# Patient Record
Sex: Female | Born: 1955 | Race: White | Hispanic: No | Marital: Married | Smoking: Never smoker
Health system: Southern US, Community
[De-identification: ages and names within clinical notes are randomized; demographics above are authoritative.]

## PROBLEM LIST (undated history)

## (undated) DIAGNOSIS — M1990 Unspecified osteoarthritis, unspecified site: Secondary | ICD-10-CM

## (undated) DIAGNOSIS — F329 Major depressive disorder, single episode, unspecified: Secondary | ICD-10-CM

## (undated) DIAGNOSIS — M0520 Rheumatoid vasculitis with rheumatoid arthritis of unspecified site: Secondary | ICD-10-CM

## (undated) DIAGNOSIS — I10 Essential (primary) hypertension: Secondary | ICD-10-CM

## (undated) DIAGNOSIS — I2510 Atherosclerotic heart disease of native coronary artery without angina pectoris: Secondary | ICD-10-CM

## (undated) DIAGNOSIS — E785 Hyperlipidemia, unspecified: Secondary | ICD-10-CM

## (undated) DIAGNOSIS — J45909 Unspecified asthma, uncomplicated: Secondary | ICD-10-CM

## (undated) DIAGNOSIS — J069 Acute upper respiratory infection, unspecified: Secondary | ICD-10-CM

## (undated) DIAGNOSIS — E119 Type 2 diabetes mellitus without complications: Secondary | ICD-10-CM

## (undated) DIAGNOSIS — C4492 Squamous cell carcinoma of skin, unspecified: Secondary | ICD-10-CM

## (undated) HISTORY — DX: Essential (primary) hypertension: I10

## (undated) HISTORY — DX: Hyperlipidemia, unspecified: E78.5

## (undated) HISTORY — PX: TONSILLECTOMY: SUR1361

## (undated) HISTORY — DX: Type 2 diabetes mellitus without complications: E11.9

## (undated) HISTORY — DX: Major depressive disorder, single episode, unspecified: F32.9

## (undated) HISTORY — DX: Squamous cell carcinoma of skin, unspecified: C44.92

## (undated) HISTORY — DX: Rheumatoid vasculitis with rheumatoid arthritis of unspecified site: M05.20

## (undated) HISTORY — DX: Atherosclerotic heart disease of native coronary artery without angina pectoris: I25.10

## (undated) HISTORY — PX: FOOT SURGERY: SHX648

## (undated) HISTORY — DX: Acute upper respiratory infection, unspecified: J06.9

---

## 2011-04-30 DIAGNOSIS — R072 Precordial pain: Secondary | ICD-10-CM

## 2015-08-06 ENCOUNTER — Telehealth (INDEPENDENT_AMBULATORY_CARE_PROVIDER_SITE_OTHER): Payer: Self-pay | Admitting: Internal Medicine

## 2015-08-06 NOTE — Telephone Encounter (Signed)
Shawna Williams left a message on the work voice mail saying she needs a colonoscopy and would like a phone call regarding this.  Pt's ph# [phone] Thank you.

## 2015-08-06 NOTE — Telephone Encounter (Signed)
Spoke to patient, advised we need referral from PCP since we've never seen her, she will call PCP and have them fax to [place]

## 2015-09-19 ENCOUNTER — Encounter (INDEPENDENT_AMBULATORY_CARE_PROVIDER_SITE_OTHER): Payer: Self-pay | Admitting: *Deleted

## 2015-10-09 ENCOUNTER — Encounter (INDEPENDENT_AMBULATORY_CARE_PROVIDER_SITE_OTHER): Payer: Self-pay | Admitting: *Deleted

## 2015-10-10 ENCOUNTER — Other Ambulatory Visit (INDEPENDENT_AMBULATORY_CARE_PROVIDER_SITE_OTHER): Payer: Self-pay | Admitting: *Deleted

## 2015-10-10 DIAGNOSIS — Z1211 Encounter for screening for malignant neoplasm of colon: Secondary | ICD-10-CM

## 2015-11-19 ENCOUNTER — Other Ambulatory Visit (INDEPENDENT_AMBULATORY_CARE_PROVIDER_SITE_OTHER): Payer: Self-pay | Admitting: *Deleted

## 2015-11-19 ENCOUNTER — Encounter (INDEPENDENT_AMBULATORY_CARE_PROVIDER_SITE_OTHER): Payer: Self-pay | Admitting: *Deleted

## 2015-11-19 NOTE — Telephone Encounter (Signed)
Patient needs trilyte 

## 2015-11-24 MED ORDER — PEG 3350-KCL-NA BICARB-NACL 420 G PO SOLR: 4000.0000 mL | Freq: Once | ORAL | Status: DC

## 2015-12-05 ENCOUNTER — Telehealth (INDEPENDENT_AMBULATORY_CARE_PROVIDER_SITE_OTHER): Payer: Self-pay | Admitting: *Deleted

## 2015-12-05 NOTE — Telephone Encounter (Signed)
Referring MD/PCP: shah   Procedure: tcs  Reason/Indication:  screening  Has patient had this procedure before?  no  If so, when, by whom and where?    Is there a family history of colon cancer?  no  Who?  What age when diagnosed?    Is patient diabetic?   no      Does patient have prosthetic heart valve or mechanical valve?  o  Do you have a pacemaker?  no  Has patient ever had endocarditis? no  Has patient had joint replacement within last 12 months?  no  Does patient tend to be constipated or take laxatives? no  Does patient have a history of alcohol/drug use?  no  Is patient on Coumadin, Plavix and/or Aspirin? no  Medications: folic acid 1 mg bid, omeprazole 20 mg daily, valsart/hctz 160/12.5 mg daily, methotrexate inj 25 mil weekly, topramate 50 mg daily, imbrel 50 mg sure click one a week  Allergies: codeine  Medication Adjustment:   Procedure date & time: 01/01/16 at 930

## 2015-12-09 NOTE — Telephone Encounter (Signed)
agree

## 2016-01-01 ENCOUNTER — Ambulatory Visit (HOSPITAL_COMMUNITY)
Admission: RE | Admit: 2016-01-01 | Discharge: 2016-01-01 | Disposition: A | Discharge status: Home / Self Care | Payer: BC Managed Care – PPO | Source: Ambulatory Visit | Attending: Internal Medicine | Admitting: Internal Medicine

## 2016-01-01 ENCOUNTER — Encounter (HOSPITAL_COMMUNITY): Payer: Self-pay | Admitting: *Deleted

## 2016-01-01 ENCOUNTER — Encounter (HOSPITAL_COMMUNITY)
Admission: RE | Disposition: A | Discharge status: Home / Self Care | Payer: Self-pay | Source: Ambulatory Visit | Attending: Internal Medicine

## 2016-01-01 DIAGNOSIS — Z1211 Encounter for screening for malignant neoplasm of colon: Secondary | ICD-10-CM

## 2016-01-01 DIAGNOSIS — M069 Rheumatoid arthritis, unspecified: Secondary | ICD-10-CM | POA: Diagnosis not present

## 2016-01-01 DIAGNOSIS — Z79899 Other long term (current) drug therapy: Secondary | ICD-10-CM | POA: Insufficient documentation

## 2016-01-01 DIAGNOSIS — K644 Residual hemorrhoidal skin tags: Secondary | ICD-10-CM

## 2016-01-01 DIAGNOSIS — K5730 Diverticulosis of large intestine without perforation or abscess without bleeding: Secondary | ICD-10-CM | POA: Insufficient documentation

## 2016-01-01 DIAGNOSIS — I10 Essential (primary) hypertension: Secondary | ICD-10-CM | POA: Insufficient documentation

## 2016-01-01 HISTORY — DX: Unspecified osteoarthritis, unspecified site: M19.90

## 2016-01-01 HISTORY — DX: Essential (primary) hypertension: I10

## 2016-01-01 HISTORY — PX: COLONOSCOPY: SHX5424

## 2016-01-01 SURGERY — COLONOSCOPY
Anesthesia: Moderate Sedation

## 2016-01-01 MED ORDER — MIDAZOLAM HCL 5 MG/5ML IJ SOLN
INTRAMUSCULAR | Status: AC
  Filled 2016-01-01: qty 10

## 2016-01-01 MED ORDER — MEPERIDINE HCL 50 MG/ML IJ SOLN
INTRAMUSCULAR | Status: DC | PRN
  Administered 2016-01-01 (×2): 25 mg via INTRAVENOUS

## 2016-01-01 MED ORDER — MIDAZOLAM HCL 5 MG/5ML IJ SOLN
INTRAMUSCULAR | Status: DC | PRN
  Administered 2016-01-01 (×2): 2 mg via INTRAVENOUS
  Administered 2016-01-01: 1 mg via INTRAVENOUS
  Administered 2016-01-01 (×2): 2 mg via INTRAVENOUS
  Administered 2016-01-01: 1 mg via INTRAVENOUS

## 2016-01-01 MED ORDER — SODIUM CHLORIDE 0.9 % IV SOLN
INTRAVENOUS | Status: DC
  Administered 2016-01-01: 1000 mL via INTRAVENOUS

## 2016-01-01 MED ORDER — MEPERIDINE HCL 50 MG/ML IJ SOLN
INTRAMUSCULAR | Status: AC
  Filled 2016-01-01: qty 1

## 2016-01-01 NOTE — H&P (Signed)
Shawna Williams is an 60 y.o. female.   Chief Complaint: Patient is here for colonoscopy. HPI: Patient is 60 year old Caucasian female who is here for screening colonoscopy. She denies abdominal pain change in bowel habits or rectal bleeding. This is patient's first exam. Family history is negative for CRC.  Past Medical History  Diagnosis Date  . Hypertension   . Arthritis     rhemuatoid    Past Surgical History  Procedure Laterality Date  . Cesarean section  1981 and 1985  . Foot surgery Left     with screws  . Tonsillectomy      Family History  Problem Relation Age of Onset  . Stroke Mother   . Diabetes Mother   . Heart attack Father   . Cancer Sister   . Diabetes Brother   . Diabetes Sister   . Heart attack Sister    Social History:  reports that she has never smoked. She does not have any smokeless tobacco history on file. She reports that she drinks alcohol. She reports that she does not use illicit drugs.  Allergies:  Allergies  Allergen Reactions  . Codeine Nausea Only    Medications Prior to Admission  Medication Sig Dispense Refill  . etanercept (ENBREL) 50 MG/ML injection Inject 50 mg into the skin once a week.    . folic acid (FOLVITE) 1 MG tablet Take 1 mg by mouth daily. Twice daily    . methotrexate (50 MG/ML) 1 g injection Inject 25 mg into the muscle once. weekly    . omeprazole (PRILOSEC) 40 MG capsule Take 40 mg by mouth daily.    . polyethylene glycol-electrolytes (NULYTELY/GOLYTELY) 420 g solution Take 4,000 mLs by mouth once. 4000 mL 0  . traZODone (DESYREL) 50 MG tablet Take 50 mg by mouth at bedtime. 1/2 tablet    . valsartan (DIOVAN) 160 MG tablet Take 160 mg by mouth daily.    Marland Kitchen venlafaxine (EFFEXOR) 75 MG tablet Take 75 mg by mouth once.      No results found for this or any previous visit (from the past 48 hour(s)). No results found.  ROS  Blood pressure 126/87, pulse 90, temperature 98.3 F (36.8 C), temperature source Oral, resp.  rate 13, height 5\' 6"  (1.676 m), weight 215 lb (97.523 kg), SpO2 97 %. Physical Exam  Constitutional: She appears well-developed and well-nourished.  HENT:  Mouth/Throat: Oropharynx is clear and moist.  Eyes: Conjunctivae are normal. No scleral icterus.  Neck: No thyromegaly present.  Cardiovascular: Normal rate, regular rhythm and normal heart sounds.   No murmur heard. Respiratory: Effort normal and breath sounds normal.  GI: She exhibits no distension and no mass. There is no tenderness.  Musculoskeletal: She exhibits no edema.  Lymphadenopathy:    She has no cervical adenopathy.  Neurological: She is alert.  Skin: Skin is warm and dry.     Assessment/Plan Average risk screening colonoscopy.  , MD 01/01/2016, 9:32 AM

## 2016-01-01 NOTE — Discharge Instructions (Signed)
Resume usual medications and high fiber diet. °No driving for 24 hours. °Next screening exam in 10 years. ° °Colonoscopy, Care After °These instructions give you information on caring for yourself after your procedure. Your doctor may also give you more specific instructions. Call your doctor if you have any problems or questions after your procedure. °HOME CARE °· Do not drive for 24 hours. °· Do not sign important papers or use machinery for 24 hours. °· You may shower. °· You may go back to your usual activities, but go slower for the first 24 hours. °· Take rest breaks often during the first 24 hours. °· Walk around or use warm packs on your belly (abdomen) if you have belly cramping or gas. °· Drink enough fluids to keep your pee (urine) clear or pale yellow. °· Resume your normal diet. Avoid heavy or fried foods. °· Avoid drinking alcohol for 24 hours or as told by your doctor. °· Only take medicines as told by your doctor. °If a tissue sample (biopsy) was taken during the procedure:  °· Do not take aspirin or blood thinners for 7 days, or as told by your doctor. °· Do not drink alcohol for 7 days, or as told by your doctor. °· Eat soft foods for the first 24 hours. °GET HELP IF: °You still have a small amount of blood in your poop (stool) 2-3 days after the procedure. °GET HELP RIGHT AWAY IF: °· You have more than a small amount of blood in your poop. °· You see clumps of tissue (blood clots) in your poop. °· Your belly is puffy (swollen). °· You feel sick to your stomach (nauseous) or throw up (vomit). °· You have a fever. °· You have belly pain that gets worse and medicine does not help. °MAKE SURE YOU: °· Understand these instructions. °· Will watch your condition. °· Will get help right away if you are not doing well or get worse. °  °This information is not intended to replace advice given to you by your health care provider. Make sure you discuss any questions you have with your health care provider. °  °Document Released: 08/07/2010 Document Revised: 07/10/2013 Document Reviewed: 03/12/2013 °Elsevier Interactive Patient Education ©2016 [employer] ° ° °Diverticulosis °Diverticulosis is the condition that develops when small pouches (diverticula) form in the wall of your colon. Your colon, or large intestine, is where water is absorbed and stool is formed. The pouches form when the inside layer of your colon pushes through weak spots in the outer layers of your colon. °CAUSES  °No one knows exactly what causes diverticulosis. °RISK FACTORS °· Being older than 50. Your risk for this condition increases with age. Diverticulosis is rare in people younger than 40 years. By age 80, almost everyone has it. °· Eating a low-fiber diet. °· Being frequently constipated. °· Being overweight. °· Not getting enough exercise. °· Smoking. °· Taking over-the-counter pain medicines, like aspirin and ibuprofen. °SYMPTOMS  °Most people with diverticulosis do not have symptoms. °DIAGNOSIS  °Because diverticulosis often has no symptoms, health care providers often discover the condition during an exam for other colon problems. In many cases, a health care provider will diagnose diverticulosis while using a flexible scope to examine the colon (colonoscopy). °TREATMENT  °If you have never developed an infection related to diverticulosis, you may not need treatment. If you have had an infection before, treatment may include: °· Eating more fruits, vegetables, and grains. °· Taking a fiber supplement. °· Taking a   live bacteria supplement (probiotic). °· Taking medicine to relax your colon. °HOME CARE INSTRUCTIONS  °· Drink at least 6-8 glasses of water each day to prevent constipation. °· Try not to strain when you have a bowel movement. °· Keep all follow-up appointments. °If you have had an infection before:  °· Increase the fiber in your diet as directed by your health care provider or dietitian. °· Take a dietary fiber supplement if  your health care provider approves. °· Only take medicines as directed by your health care provider. °SEEK MEDICAL CARE IF:  °· You have abdominal pain. °· You have bloating. °· You have cramps. °· You have not gone to the bathroom in 3 days. °SEEK IMMEDIATE MEDICAL CARE IF:  °· Your pain gets worse. °· Your bloating becomes very bad. °· You have a fever or chills, and your symptoms suddenly get worse. °· You begin vomiting. °· You have bowel movements that are bloody or black. °MAKE SURE YOU: °· Understand these instructions. °· Will watch your condition. °· Will get help right away if you are not doing well or get worse. °  °This information is not intended to replace advice given to you by your health care provider. Make sure you discuss any questions you have with your health care provider. °  °Document Released: 04/01/2004 Document Revised: 07/10/2013 Document Reviewed: 05/30/2013 °Elsevier Interactive Patient Education ©2016 [employer] ° ° °High-Fiber Diet °Fiber, also called dietary fiber, is a type of carbohydrate found in fruits, vegetables, whole grains, and beans. A high-fiber diet can have many health benefits. Your health care provider may recommend a high-fiber diet to help: °· Prevent constipation. Fiber can make your bowel movements more regular. °· Lower your cholesterol. °· Relieve hemorrhoids, uncomplicated diverticulosis, or irritable bowel syndrome. °· Prevent overeating as part of a weight-loss plan. °· Prevent heart disease, type 2 diabetes, and certain cancers. °WHAT IS MY PLAN? °The recommended daily intake of fiber includes: °· 38 grams for men under age 50. °· 30 grams for men over age 50. °· 25 grams for women under age 50. °· 21 grams for women over age 50. °You can get the recommended daily intake of dietary fiber by eating a variety of fruits, vegetables, grains, and beans. Your health care provider may also recommend a fiber supplement if it is not possible to get enough fiber  through your diet. °WHAT DO I NEED TO KNOW ABOUT A HIGH-FIBER DIET? °· Fiber supplements have not been widely studied for their effectiveness, so it is better to get fiber through food sources. °· Always check the fiber content on the nutrition facts label of any prepackaged food. Look for foods that contain at least 5 grams of fiber per serving. °· Ask your dietitian if you have questions about specific foods that are related to your condition, especially if those foods are not listed in the following section. °· Increase your daily fiber consumption gradually. Increasing your intake of dietary fiber too quickly may cause bloating, cramping, or gas. °· Drink plenty of water. Water helps you to digest fiber. °WHAT FOODS CAN I EAT? °Grains °Whole-grain breads. Multigrain cereal. Oats and oatmeal. Brown rice. Barley. Bulgur wheat. Millet. Bran muffins. Popcorn. Rye wafer crackers. °Vegetables °Sweet potatoes. Spinach. Kale. Artichokes. Cabbage. Broccoli. Green peas. Carrots. Squash. °Fruits °Berries. Pears. Apples. Oranges. Avocados. Prunes and raisins. Dried figs. °Meats and Other Protein Sources °Navy, kidney, pinto, and soy beans. Split peas. Lentils. Nuts and seeds. °Dairy °Fiber-fortified yogurt. °Beverages °Fiber-fortified soy milk. Fiber-fortified   orange juice. °Other °Fiber bars. °The items listed above may not be a complete list of recommended foods or beverages. Contact your dietitian for more options. °WHAT FOODS ARE NOT RECOMMENDED? °Grains °White bread. Pasta made with refined flour. White rice. °Vegetables °Fried potatoes. Canned vegetables. Well-cooked vegetables.  °Fruits °Fruit juice. Cooked, strained fruit. °Meats and Other Protein Sources °Fatty cuts of meat. Fried poultry or fried fish. °Dairy °Milk. Yogurt. Cream cheese. Sour cream. °Beverages °Soft drinks. °Other °Cakes and pastries. Butter and oils. °The items listed above may not be a complete list of foods and beverages to avoid. Contact your  dietitian for more information. °WHAT ARE SOME TIPS FOR INCLUDING HIGH-FIBER FOODS IN MY DIET? °· Eat a wide variety of high-fiber foods. °· Make sure that half of all grains consumed each day are whole grains. °· Replace breads and cereals made from refined flour or white flour with whole-grain breads and cereals. °· Replace white rice with brown rice, bulgur wheat, or millet. °· Start the day with a breakfast that is high in fiber, such as a cereal that contains at least 5 grams of fiber per serving. °· Use beans in place of meat in soups, salads, or pasta. °· Eat high-fiber snacks, such as berries, raw vegetables, nuts, or popcorn. °  °This information is not intended to replace advice given to you by your health care provider. Make sure you discuss any questions you have with your health care provider. °  °Document Released: 07/05/2005 Document Revised: 07/26/2014 Document Reviewed: 12/18/2013 °Elsevier Interactive Patient Education ©2016 [employer]vier Inc. ° °

## 2016-01-01 NOTE — Op Note (Signed)
[facility] Patient Name: Shawna Williams Procedure Date: 01/01/2016 9:14 AM MRN: [id number] Date of Birth: [birth year] Attending MD: Lionel December , MD CSN: [id number] Age: 60 Admit Type: Outpatient Procedure:                Colonoscopy Indications:              Screening for colorectal malignant neoplasm Providers:                Lionel December, MD, Nena Polio, RN, Calton Dach,                            Technician Referring MD:             Carmelina Peal Sherryll Burger, MD Medicines:                Meperidine 50 mg IV, Midazolam 10 mg IV Complications:            No immediate complications. Estimated Blood Loss:     Estimated blood loss: none. Procedure:                Pre-Anesthesia Assessment:                           - Prior to the procedure, a History and Physical                            was performed, and patient medications and                            allergies were reviewed. The patient's tolerance of                            previous anesthesia was also reviewed. The risks                            and benefits of the procedure and the sedation                            options and risks were discussed with the patient.                            All questions were answered, and informed consent                            was obtained. Prior Anticoagulants: The patient has                            taken no previous anticoagulant or antiplatelet                            agents. ASA Grade Assessment: II - A patient with                            mild systemic disease. After reviewing the risks  and benefits, the patient was deemed in                            satisfactory condition to undergo the procedure.                           After obtaining informed consent, the colonoscope                            was passed under direct vision. Throughout the                            procedure, the patient's blood pressure, pulse, and               oxygen saturations were monitored continuously. The                            [id number] ([id number]) scope was introduced through                            the anus and advanced to the the terminal ileum.                            The terminal ileum, ileocecal valve, appendiceal                            orifice, and rectum were photographed. The quality                            of the bowel preparation was excellent. Scope In: 9:45:11 AM Scope Out: 10:05:58 AM Scope Withdrawal Time: 0 hours 10 minutes 34 seconds  Total Procedure Duration: 0 hours 20 minutes 47 seconds  Findings:      The terminal ileum appeared normal.      Scattered medium-mouthed diverticula were found in the sigmoid colon.      External hemorrhoids were found during retroflexion. The hemorrhoids       were small. Impression:               - The examined portion of the ileum was normal.                           - Diverticulosis in the sigmoid colon.                           - External hemorrhoids.                           - No specimens collected. Moderate Sedation:      Moderate (conscious) sedation was administered by the endoscopy nurse       and supervised by the endoscopist. The following parameters were       monitored: oxygen saturation, heart rate, blood pressure, CO2       capnography and response to care. Total physician intraservice time was       28 minutes. Recommendation:           - Patient  has a contact number available for                            emergencies. The signs and symptoms of potential                            delayed complications were discussed with the                            patient. Return to normal activities tomorrow.                            Written discharge instructions were provided to the                            patient.                           - High fiber diet today.                           - Continue present medications.                            - Repeat colonoscopy in 10 years for screening                            purposes. Procedure Code(s):        --- Professional ---                           [phone], Colonoscopy, flexible; diagnostic, including                            collection of specimen(s) by brushing or washing,                            when performed (separate procedure)                           99152, Moderate sedation services provided by the                            same physician or other qualified health care                            professional performing the diagnostic or                            therapeutic service that the sedation supports,                            requiring the presence of an independent trained                            observer to assist in the monitoring of  the                            patient's level of consciousness and physiological                            status; initial 15 minutes of intraservice time,                            patient age 31 years or older                           [phone], Moderate sedation services; each additional                            15 minutes intraservice time Diagnosis Code(s):        --- Professional ---                           Z12.11, Encounter for screening for malignant                            neoplasm of colon                           K64.4, Residual hemorrhoidal skin tags                           K57.30, Diverticulosis of large intestine without                            perforation or abscess without bleeding CPT copyright 2016 [employer]. All rights reserved. The codes documented in this report are preliminary and upon coder review may  be revised to meet current compliance requirements. Lionel December, MD Lionel December, MD 01/01/2016 10:15:23 AM This report has been signed electronically. Number of Addenda: 0

## 2016-01-02 ENCOUNTER — Encounter (HOSPITAL_COMMUNITY): Payer: Self-pay | Admitting: Internal Medicine

## 2016-05-12 ENCOUNTER — Other Ambulatory Visit: Payer: Self-pay | Admitting: Rheumatology

## 2016-05-12 ENCOUNTER — Telehealth: Payer: Self-pay | Admitting: Radiology

## 2016-05-12 NOTE — Telephone Encounter (Signed)
Called patient, left message. She is due for TB gold lab testing /she is on Enbrel

## 2016-05-12 NOTE — Telephone Encounter (Signed)
Patient needs follow up appt with Dr Corliss Skains pls call to make appt. She is due November please

## 2016-05-12 NOTE — Telephone Encounter (Signed)
LMOM for patient to call back and schedule November follow up.

## 2016-05-26 ENCOUNTER — Other Ambulatory Visit: Payer: Self-pay | Admitting: Rheumatology

## 2016-05-26 NOTE — Telephone Encounter (Signed)
Last visit 04/13/16 Next visit  06/08/16 Ok to refill per Dr Corliss Skains

## 2016-06-03 DIAGNOSIS — G894 Chronic pain syndrome: Secondary | ICD-10-CM | POA: Insufficient documentation

## 2016-06-03 DIAGNOSIS — M0579 Rheumatoid arthritis with rheumatoid factor of multiple sites without organ or systems involvement: Secondary | ICD-10-CM | POA: Insufficient documentation

## 2016-06-03 DIAGNOSIS — G4709 Other insomnia: Secondary | ICD-10-CM | POA: Insufficient documentation

## 2016-06-03 DIAGNOSIS — R5383 Other fatigue: Secondary | ICD-10-CM | POA: Insufficient documentation

## 2016-06-03 DIAGNOSIS — Z79899 Other long term (current) drug therapy: Secondary | ICD-10-CM | POA: Insufficient documentation

## 2016-06-03 DIAGNOSIS — M7918 Myalgia, other site: Secondary | ICD-10-CM | POA: Insufficient documentation

## 2016-06-03 NOTE — Progress Notes (Deleted)
   Office Visit Note  Patient: Shawna Williams             Date of Birth: [birth year]           MRN: [id number]             PCP: Kirstie Peri, MD Referring: Kirstie Peri, MD Visit Date: 06/08/2016 Occupation: @GUAROCC @    Subjective:  No chief complaint on file.   History of Present Illness: Shawna Williams is a 60 y.o. female ***   Activities of Daily Living:  Patient reports morning stiffness for *** {minute/hour:19697}.   Patient {ACTIONS;DENIES/REPORTS:21021675::"Denies"} nocturnal pain.  Difficulty dressing/grooming: {ACTIONS;DENIES/REPORTS:21021675::"Denies"} Difficulty climbing stairs: {ACTIONS;DENIES/REPORTS:21021675::"Denies"} Difficulty getting out of chair: {ACTIONS;DENIES/REPORTS:21021675::"Denies"} Difficulty using hands for taps, buttons, cutlery, and/or writing: {ACTIONS;DENIES/REPORTS:21021675::"Denies"}   No Rheumatology ROS completed.   PMFS History:  There are no active problems to display for this patient.   Past Medical History:  Diagnosis Date  . Arthritis    rhemuatoid  . Hypertension     Family History  Problem Relation Age of Onset  . Stroke Mother   . Diabetes Mother   . Heart attack Father   . Cancer Sister   . Diabetes Brother   . Diabetes Sister   . Heart attack Sister    Past Surgical History:  Procedure Laterality Date  . CESAREAN SECTION  1981 and 1985  . COLONOSCOPY N/A 01/01/2016   Procedure: COLONOSCOPY;  Surgeon: 01/03/2016, MD;  Location: [facility];  Service: Endoscopy;  Laterality: N/A;  930  . FOOT SURGERY Left    with screws  . TONSILLECTOMY     Social History   Social History Narrative  . No narrative on file     Objective: Vital Signs: There were no vitals taken for this visit.   Physical Exam   Musculoskeletal Exam: ***  CDAI Exam: No CDAI exam completed.    Investigation: Findings:  04/14/2016 CBC CMP normal     Imaging: No results found.  Speciality Comments: No specialty comments  available.    Procedures:  No procedures performed Allergies: Codeine   Assessment / Plan: Visit Diagnoses: No diagnosis found.    Orders: No orders of the defined types were placed in this encounter.  No orders of the defined types were placed in this encounter.   Face-to-face time spent with patient was *** minutes. 50% of time was spent in counseling and coordination of care.  Follow-Up Instructions: No Follow-up on file.   Terissa Haffey, RT

## 2016-06-08 ENCOUNTER — Ambulatory Visit: Payer: Self-pay | Admitting: Rheumatology

## 2016-06-19 ENCOUNTER — Other Ambulatory Visit: Payer: Self-pay | Admitting: Rheumatology

## 2016-06-21 NOTE — Telephone Encounter (Signed)
Last Visit: 01/09/16 Next Visit: 06/30/16 Labs: 03/29/16 WNL  Okay to refill  Enbrel?

## 2016-06-28 DIAGNOSIS — M19042 Primary osteoarthritis, left hand: Secondary | ICD-10-CM | POA: Insufficient documentation

## 2016-06-28 DIAGNOSIS — M19041 Primary osteoarthritis, right hand: Secondary | ICD-10-CM | POA: Insufficient documentation

## 2016-06-28 DIAGNOSIS — M19071 Primary osteoarthritis, right ankle and foot: Secondary | ICD-10-CM | POA: Insufficient documentation

## 2016-06-28 DIAGNOSIS — M19072 Primary osteoarthritis, left ankle and foot: Secondary | ICD-10-CM

## 2016-06-28 NOTE — Progress Notes (Signed)
Office Visit Note  Patient: Shawna Williams             Date of Birth: May 01, 1956           MRN: [id number]             PCP: Kirstie Peri, MD Referring: Kirstie Peri, MD Visit Date: 06/30/2016 Occupation: @GUAROCC @    Subjective:  No chief complaint on file. Follow-up on rheumatoid arthritis high risk prescription fibromyalgia discomfort feet pain and low back pain   History of Present Illness: Shawna Williams is a 60 y.o. female   Complaining of feet pain and back pain. However rheumatoid arthritis is doing very well and fibromyalgia overall is doing well. Pain to the feet get worse as patient is bearing weight.   Activities of Daily Living:  Patient reports morning stiffness for 30 minutes.   Patient Denies nocturnal pain.  Difficulty dressing/grooming: Denies Difficulty climbing stairs: Denies Difficulty getting out of chair: Denies Difficulty using hands for taps, buttons, cutlery, and/or writing: Denies   Review of Systems  Constitutional: Positive for fatigue.  HENT: Negative for mouth sores and mouth dryness.   Eyes: Negative for dryness.  Respiratory: Negative for shortness of breath.   Gastrointestinal: Negative for constipation and diarrhea.  Musculoskeletal: Positive for myalgias and myalgias.  Skin: Negative for sensitivity to sunlight.  Psychiatric/Behavioral: Positive for sleep disturbance. Negative for decreased concentration.    PMFS History:  Patient Active Problem List   Diagnosis Date Noted  . Primary osteoarthritis of both hands 06/28/2016  . Primary osteoarthritis of both feet 06/28/2016  . 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 High risk medication use 06/03/2016  . Myofacial muscle pain 06/03/2016  . Other fatigue 06/03/2016  . Other insomnia 06/03/2016  . Chronic pain syndrome 06/03/2016    Past Medical History:  Diagnosis Date  . Arthritis    rhemuatoid  . Hypertension     Family History  Problem Relation  Age of Onset  . Stroke Mother   . Diabetes Mother   . Heart attack Father   . Cancer Sister   . Diabetes Brother   . Diabetes Sister   . Heart attack Sister    Past Surgical History:  Procedure Laterality Date  . CESAREAN SECTION  1981 and 1985  . COLONOSCOPY N/A 01/01/2016   Procedure: COLONOSCOPY;  Surgeon: 01/03/2016, MD;  Location: [facility];  Service: Endoscopy;  Laterality: N/A;  930  . FOOT SURGERY Left    with screws  . TONSILLECTOMY     Social History   Social History Narrative  . No narrative on file     Objective: Vital Signs: BP 136/87 (BP Location: Left Arm, Patient Position: Sitting, Cuff Size: Large)   Pulse 87   Resp 14   Ht 5\' 6"  (1.676 m)   Wt 223 lb (101.2 kg)   BMI 35.99 kg/m    Physical Exam  Constitutional: She is oriented to person, place, and time. She appears well-developed and well-nourished.  HENT:  Head: Normocephalic and atraumatic.  Eyes: EOM are normal. Pupils are equal, round, and reactive to light.  Cardiovascular: Normal rate, regular rhythm and normal heart sounds.  Exam reveals no gallop and no friction rub.   No murmur heard. Pulmonary/Chest: Effort normal and breath sounds normal. She has no wheezes. She has no rales.  Abdominal: Soft. Bowel sounds are normal. She exhibits no distension. There is no tenderness. There is no guarding. No hernia.  Musculoskeletal: Normal range of motion. She exhibits no edema, tenderness or deformity.  Lymphadenopathy:    She has no cervical adenopathy.  Neurological: She is alert and oriented to person, place, and time. Coordination normal.  Skin: Skin is warm and dry. Capillary refill takes less than 2 seconds. No rash noted.  Psychiatric: She has a normal mood and affect. Her behavior is normal.     Musculoskeletal Exam:  Full range of motion of all joints Grip strength is equal and strong bilaterally Fiber myalgia tender points are all absent  CDAI Exam: CDAI Homunculus Exam:    Joint Counts:  CDAI Tender Joint count: 0 CDAI Swollen Joint count: 0  Global Assessments:  Patient Global Assessment: 4 Provider Global Assessment: 4  CDAI Calculated Score: 8  No synovitis on examination  Investigation: Findings:  UDS 06/11/15 narcotic agreement 07/08/15 High-risk prescriptions  On methotrexate 0.8 mL per week and folic acid 2 per day.  Enbrel every week.   04/07/2016 CBC normal except PLT count 382 CMP with GFR normal 03/2014 negative TB gold, HIV, and Hepatitis screening panel     Imaging: No results found.  Speciality Comments: No specialty comments available.    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  Patient was instructed to go shoe inserts from [employer] since her feet are hurting. I believe her pain is from osteoarthritis and poor support area  Return to clinic in 5 months  Note that we did not refill her tizanidine because we asked her to stop the medication on the last visit. Instead, we gave her trazodone.  Orders: Orders Placed This Encounter  Procedures  . CBC with Differential/Platelet  . Comprehensive metabolic panel   Meds ordered this encounter  Medications  . methotrexate 50 MG/2ML injection    Sig: Inject 0.8 mLs (20 mg total) into the skin once.    Dispense:  10 mL    Refill:  0    2 ML vials  with preservatives 5 vials for enough for 3 months or 12 doses    Order Specific Question:   Supervising Provider    Answer:   Pollyann Savoy [[id number]]  . folic acid (FOLVITE) 1 MG tablet    Sig: Take 2 tablets (2 mg total) by mouth daily. Twice daily    Dispense:  90 tablet    Refill:  4    Order Specific Question:   Supervising Provider    Answer:   Pollyann Savoy [[id number]]  . traMADol (ULTRAM) 50 MG tablet    Sig: Take 1 tablet (50 mg total) by mouth at bedtime.    Dispense:  30 tablet    Refill:  2    Order Specific Question:   Supervising Provider    Answer:   Pollyann Savoy [[id number]]  . traZODone (DESYREL) 50 MG tablet    Sig: Take 1 tablet (50 mg total) by mouth at bedtime.    Dispense:  30 tablet  Refill:  3    Generic IPJ:[id number] 50 MG TABLET    Order Specific Question:   Supervising Provider    Answer:   Pollyann Savoy [[id number]]  . diclofenac sodium (VOLTAREN) 1 % GEL    Sig: Voltaren Gel 3 grams to 3 large joints upto TID 3 TUBES with 3 refills    Dispense:  3 Tube    Refill:  3    Order Specific Question:   Supervising Provider    Answer:   Vanessa Kick    Face-to-face time spent with patient was 30 minutes. 50% of time was spent in counseling and coordination of care.  Follow-Up Instructions: Return in about 5 months (around 11/28/2016) for RA, Enbrel q wk, MTX 0.8,folic.   Tawni Pummel, PA-C   I examined and evaluated the patient with Tawni Pummel PA. The plan of care was discussed as noted above.  Pollyann Savoy, MD

## 2016-06-30 ENCOUNTER — Encounter: Payer: Self-pay | Admitting: Rheumatology

## 2016-06-30 ENCOUNTER — Ambulatory Visit (INDEPENDENT_AMBULATORY_CARE_PROVIDER_SITE_OTHER): Payer: BC Managed Care – PPO | Admitting: Rheumatology

## 2016-06-30 VITALS — BP 136/87 | HR 87 | Resp 14 | Ht 66.0 in | Wt 223.0 lb

## 2016-06-30 DIAGNOSIS — Z79899 Other long term (current) drug therapy: Secondary | ICD-10-CM | POA: Diagnosis not present

## 2016-06-30 DIAGNOSIS — G4709 Other insomnia: Secondary | ICD-10-CM

## 2016-06-30 DIAGNOSIS — M19072 Primary osteoarthritis, left ankle and foot: Secondary | ICD-10-CM

## 2016-06-30 DIAGNOSIS — M791 Myalgia: Secondary | ICD-10-CM

## 2016-06-30 DIAGNOSIS — M19042 Primary osteoarthritis, left hand: Secondary | ICD-10-CM | POA: Diagnosis not present

## 2016-06-30 DIAGNOSIS — M7918 Myalgia, other site: Secondary | ICD-10-CM

## 2016-06-30 DIAGNOSIS — G894 Chronic pain syndrome: Secondary | ICD-10-CM

## 2016-06-30 DIAGNOSIS — M19071 Primary osteoarthritis, right ankle and foot: Secondary | ICD-10-CM

## 2016-06-30 DIAGNOSIS — M19041 Primary osteoarthritis, right hand: Secondary | ICD-10-CM

## 2016-06-30 DIAGNOSIS — M0579 Rheumatoid arthritis with rheumatoid factor of multiple sites without organ or systems involvement: Secondary | ICD-10-CM

## 2016-06-30 DIAGNOSIS — R5383 Other fatigue: Secondary | ICD-10-CM

## 2016-06-30 LAB — CBC WITH DIFFERENTIAL/PLATELET
Basophils Absolute: 91 cells/uL (ref 0–200)
Basophils Relative: 1 %
Eosinophils Absolute: 364 cells/uL (ref 15–500)
Eosinophils Relative: 4 %
HCT: 37.4 % (ref 35.0–45.0)
Hemoglobin: 12.6 g/dL (ref 11.7–15.5)
Lymphocytes Relative: 36 %
Lymphs Abs: 3276 cells/uL (ref 850–3900)
MCH: 31.3 pg (ref 27.0–33.0)
MCHC: 33.7 g/dL (ref 32.0–36.0)
MCV: 92.8 fL (ref 80.0–100.0)
MPV: 10.4 fL (ref 7.5–12.5)
Monocytes Absolute: 637 cells/uL (ref 200–950)
Monocytes Relative: 7 %
Neutro Abs: 4732 cells/uL (ref 1500–7800)
Neutrophils Relative %: 52 %
Platelets: 341 10*3/uL (ref 140–400)
RBC: 4.03 MIL/uL (ref 3.80–5.10)
RDW: 14.6 % (ref 11.0–15.0)
WBC: 9.1 10*3/uL (ref 3.8–10.8)

## 2016-06-30 LAB — COMPREHENSIVE METABOLIC PANEL
ALT: 21 U/L (ref 6–29)
AST: 20 U/L (ref 10–35)
Albumin: 4.2 g/dL (ref 3.6–5.1)
Alkaline Phosphatase: 91 U/L (ref 33–130)
BUN: 22 mg/dL (ref 7–25)
CO2: 23 mmol/L (ref 20–31)
Calcium: 9.5 mg/dL (ref 8.6–10.4)
Chloride: 106 mmol/L (ref 98–110)
Creat: 0.92 mg/dL (ref 0.50–0.99)
Glucose, Bld: 99 mg/dL (ref 65–99)
Potassium: 3.9 mmol/L (ref 3.5–5.3)
Sodium: 139 mmol/L (ref 135–146)
Total Bilirubin: 0.6 mg/dL (ref 0.2–1.2)
Total Protein: 7.5 g/dL (ref 6.1–8.1)

## 2016-06-30 MED ORDER — TRAZODONE HCL 50 MG PO TABS: 50.0000 mg | ORAL_TABLET | Freq: Every day | ORAL | 3 refills | Status: DC

## 2016-06-30 MED ORDER — FOLIC ACID 1 MG PO TABS: 2.0000 mg | ORAL_TABLET | Freq: Every day | ORAL | 4 refills | Status: DC

## 2016-06-30 MED ORDER — DICLOFENAC SODIUM 1 % TD GEL: TRANSDERMAL | 3 refills | Status: DC

## 2016-06-30 MED ORDER — TRAMADOL HCL 50 MG PO TABS: 50.0000 mg | ORAL_TABLET | Freq: Every day | ORAL | 2 refills | Status: DC

## 2016-06-30 MED ORDER — METHOTREXATE SODIUM CHEMO INJECTION 50 MG/2ML: 20.0000 mg | Freq: Once | INTRAMUSCULAR | 0 refills | Status: AC

## 2016-07-01 NOTE — Progress Notes (Signed)
Tell patient1: CMP with GFR is normal and CBC with differential is normal from 06/30/2016 labs.

## 2016-08-31 ENCOUNTER — Other Ambulatory Visit: Payer: Self-pay | Admitting: Rheumatology

## 2016-08-31 NOTE — Telephone Encounter (Signed)
Last Visit: 06/30/16 Next Visit: 12/28/16 Labs: 06/30/16 WNL  Okay to refill MTX and Folic Acid?

## 2016-09-03 ENCOUNTER — Other Ambulatory Visit: Payer: Self-pay | Admitting: *Deleted

## 2016-09-03 ENCOUNTER — Other Ambulatory Visit: Payer: Self-pay | Admitting: Rheumatology

## 2016-09-03 DIAGNOSIS — Z9225 Personal history of immunosupression therapy: Secondary | ICD-10-CM

## 2016-09-03 DIAGNOSIS — Z79899 Other long term (current) drug therapy: Secondary | ICD-10-CM

## 2016-09-03 NOTE — Telephone Encounter (Signed)
Last Visit: 06/30/16  Next Visit: 12/28/16 Labs: 06/30/16 WNL TB Gold: 03/2015 Neg Patient going today to update labs  Okay to refill Enbrel?

## 2016-09-04 LAB — CMP14+EGFR
ALT: 27 IU/L (ref 0–32)
AST: 23 IU/L (ref 0–40)
Albumin/Globulin Ratio: 1.4 (ref 1.2–2.2)
Albumin: 4.3 g/dL (ref 3.6–4.8)
Alkaline Phosphatase: 88 IU/L (ref 39–117)
BUN/Creatinine Ratio: 29 — ABNORMAL HIGH (ref 12–28)
BUN: 28 mg/dL — ABNORMAL HIGH (ref 8–27)
Bilirubin Total: 0.4 mg/dL (ref 0.0–1.2)
CO2: 22 mmol/L (ref 18–29)
Calcium: 9.4 mg/dL (ref 8.7–10.3)
Chloride: 99 mmol/L (ref 96–106)
Creatinine, Ser: 0.98 mg/dL (ref 0.57–1.00)
GFR calc Af Amer: 73 mL/min/{1.73_m2} (ref >59)
GFR calc non Af Amer: 63 mL/min/{1.73_m2} (ref >59)
Globulin, Total: 3.1 g/dL (ref 1.5–4.5)
Glucose: 101 mg/dL — ABNORMAL HIGH (ref 65–99)
Potassium: 3.9 mmol/L (ref 3.5–5.2)
Sodium: 141 mmol/L (ref 134–144)
Total Protein: 7.4 g/dL (ref 6.0–8.5)

## 2016-09-04 LAB — CBC WITH DIFFERENTIAL/PLATELET
Basophils Absolute: 0 10*3/uL (ref 0.0–0.2)
Basos: 1 %
EOS (ABSOLUTE): 0.3 10*3/uL (ref 0.0–0.4)
Eos: 3 %
Hematocrit: 37 % (ref 34.0–46.6)
Hemoglobin: 12.2 g/dL (ref 11.1–15.9)
Immature Grans (Abs): 0 10*3/uL (ref 0.0–0.1)
Immature Granulocytes: 0 %
Lymphocytes Absolute: 3.3 10*3/uL — ABNORMAL HIGH (ref 0.7–3.1)
Lymphs: 40 %
MCH: 30.9 pg (ref 26.6–33.0)
MCHC: 33 g/dL (ref 31.5–35.7)
MCV: 94 fL (ref 79–97)
Monocytes Absolute: 0.5 10*3/uL (ref 0.1–0.9)
Monocytes: 6 %
Neutrophils Absolute: 4.2 10*3/uL (ref 1.4–7.0)
Neutrophils: 50 %
Platelets: 402 10*3/uL — ABNORMAL HIGH (ref 150–379)
RBC: 3.95 x10E6/uL (ref 3.77–5.28)
RDW: 14.1 % (ref 12.3–15.4)
WBC: 8.3 10*3/uL (ref 3.4–10.8)

## 2016-09-08 LAB — QUANTIFERON IN TUBE
QFT TB AG MINUS NIL VALUE: 0.02 IU/mL
QUANTIFERON MITOGEN VALUE: 5.49 IU/mL
QUANTIFERON TB AG VALUE: 0.07 IU/mL
QUANTIFERON TB GOLD: NEGATIVE
Quantiferon Nil Value: 0.05 IU/mL

## 2016-09-08 LAB — QUANTIFERON TB GOLD ASSAY (BLOOD)

## 2016-09-13 ENCOUNTER — Telehealth: Payer: Self-pay | Admitting: Radiology

## 2016-09-13 NOTE — Telephone Encounter (Signed)
-----   Message from Tawni Pummel, [place] sent at 09/08/2016  6:11 PM EST ----- Send labs to PCP And tell patient TB gold is negative

## 2016-09-13 NOTE — Telephone Encounter (Signed)
I have called patient to advise labs are normal  

## 2016-11-10 ENCOUNTER — Telehealth: Payer: Self-pay

## 2016-11-10 NOTE — Telephone Encounter (Signed)
Received a fax from [facility] regarding a prior authorization DENIAL for Voltaren Gel   Phone number:[phone]  Left message for patient to call back to update her.  Will send documents to scan center  Abran Duke, CPhT  9:14 AM

## 2016-11-19 NOTE — Telephone Encounter (Addendum)
Patient returned call. Patient states she received a letter in the mail from [facility].I informed her of the GoodRx coupon she could use at a retail pharmacy to help with the price. Patient voiced understanding and denied any questions about her medication at this time.  Cathleen Yagi, [place], CPhT 1:34 PM

## 2016-12-02 ENCOUNTER — Encounter (INDEPENDENT_AMBULATORY_CARE_PROVIDER_SITE_OTHER): Payer: Self-pay | Admitting: *Deleted

## 2016-12-06 ENCOUNTER — Other Ambulatory Visit: Payer: Self-pay | Admitting: Rheumatology

## 2016-12-06 NOTE — Telephone Encounter (Signed)
Last Visit: 06/30/16  Next Visit: 12/28/16 Labs: 09/03/16 WNL TB Gold: 09/03/16 Neg  Okay to refill Enbrel?

## 2016-12-06 NOTE — Telephone Encounter (Signed)
Ok, please reminder about her labs

## 2016-12-09 ENCOUNTER — Telehealth: Payer: Self-pay | Admitting: Rheumatology

## 2016-12-09 NOTE — Telephone Encounter (Signed)
Patient has had a chronic cough since October. Patient gets no better with treatment. Going for endoscopy. Patient stopped Embriel, and MTX. Would like to discuss treatment options at this point. Please advise.

## 2016-12-10 NOTE — Telephone Encounter (Signed)
If there is no infection that she can resume her methotrexate and Enbrel

## 2016-12-10 NOTE — Telephone Encounter (Signed)
Attempted to contact the patient and left message for patient to call the office.  

## 2016-12-10 NOTE — Telephone Encounter (Signed)
Patient advised she may restart the MTX and Enbrel.

## 2016-12-10 NOTE — Telephone Encounter (Signed)
Patient states she was given Prednisone and an antibiotic in November. Patient states she saw her PCP and they changed her acid reflux medication which she had a reaction to and has returned to taking the Prilosec. Patient states she has not taken her MTX or Enbrel in 5 weeks due to the cough. Patient would like to be advise how to proceed with her medications. She is scheduled to see a GI on 12/20/16 and from there should have an endoscopy scheduled.

## 2016-12-20 ENCOUNTER — Other Ambulatory Visit: Payer: Self-pay | Admitting: Rheumatology

## 2016-12-20 ENCOUNTER — Ambulatory Visit (INDEPENDENT_AMBULATORY_CARE_PROVIDER_SITE_OTHER): Payer: BC Managed Care – PPO | Admitting: Internal Medicine

## 2016-12-20 NOTE — Telephone Encounter (Signed)
Last Visit: 06/30/16 Next Visit: 12/28/16  Okay to refill Trazodone?

## 2016-12-24 ENCOUNTER — Ambulatory Visit (INDEPENDENT_AMBULATORY_CARE_PROVIDER_SITE_OTHER): Payer: BC Managed Care – PPO | Admitting: Internal Medicine

## 2016-12-24 ENCOUNTER — Encounter (INDEPENDENT_AMBULATORY_CARE_PROVIDER_SITE_OTHER): Payer: Self-pay

## 2016-12-24 ENCOUNTER — Ambulatory Visit (HOSPITAL_COMMUNITY)
Admission: RE | Admit: 2016-12-24 | Discharge: 2016-12-24 | Disposition: A | Discharge status: Home / Self Care | Payer: BC Managed Care – PPO | Source: Ambulatory Visit | Attending: Internal Medicine | Admitting: Internal Medicine

## 2016-12-24 ENCOUNTER — Encounter (INDEPENDENT_AMBULATORY_CARE_PROVIDER_SITE_OTHER): Payer: Self-pay | Admitting: Internal Medicine

## 2016-12-24 VITALS — BP 146/90 | HR 64 | Temp 98.3°F | Ht 65.0 in | Wt 210.3 lb

## 2016-12-24 DIAGNOSIS — R062 Wheezing: Secondary | ICD-10-CM | POA: Diagnosis not present

## 2016-12-24 DIAGNOSIS — R05 Cough: Secondary | ICD-10-CM | POA: Diagnosis not present

## 2016-12-24 DIAGNOSIS — I00 Rheumatic fever without heart involvement: Secondary | ICD-10-CM | POA: Diagnosis not present

## 2016-12-24 DIAGNOSIS — M0520 Rheumatoid vasculitis with rheumatoid arthritis of unspecified site: Secondary | ICD-10-CM

## 2016-12-24 DIAGNOSIS — R059 Cough, unspecified: Secondary | ICD-10-CM

## 2016-12-24 HISTORY — DX: Rheumatoid vasculitis with rheumatoid arthritis of unspecified site: M05.20

## 2016-12-24 NOTE — Progress Notes (Signed)
Subjective:    Patient ID: Shawna Williams, female    DOB: Jul 17, 1956, 61 y.o.   MRN: [id number]  HPI  Referred by DR. Sherryll Burger for a cough. She tells me she has had a cough since October. She says the cough is in the center of her chest. She is on Prilosec for her GERD. She sometimes wheezes.  She sometimes coughs up mucous.  She says she does not feel bad.  She says it sounds like a smokers cough . She can hear herself wheeze. She does not think this is acid reflux.    12/22/2015 Colonoscopy: Dr. Karilyn Cota: screening. Impression:               - The examined portion of the ileum was normal.                           - Diverticulosis in the sigmoid colon.                           - External hemorrhoids. Review of Systems Past Medical History:  Diagnosis Date  . Arthritis    rhemuatoid  . Hypertension   . 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 CESAREAN SECTION  1981 and 1985  . COLONOSCOPY N/A 01/01/2016   Procedure: COLONOSCOPY;  Surgeon: Malissa Hippo, MD;  Location: [facility];  Service: Endoscopy;  Laterality: N/A;  930  . FOOT SURGERY Left    with screws  . TONSILLECTOMY      Allergies  Allergen Reactions  . Codeine Nausea Only    Current Outpatient Prescriptions on File Prior to Visit  Medication Sig Dispense Refill  . diclofenac sodium (VOLTAREN) 1 % GEL Voltaren Gel 3 grams to 3 large joints upto TID 3 TUBES with 3 refills 3 Tube 3  . etanercept (ENBREL) 50 MG/ML injection Inject 50 mg into the skin once a week.    . folic acid (FOLVITE) 1 MG tablet TAKE 2 TABLETS BY MOUTH EVERY DAY 180 tablet 3  . methotrexate 50 MG/2ML injection INJECT 0.8 MLS INTO THE SKIN ONCE ( 4 MLS FOR 30 DAYS) (Patient taking differently: INJECT 0.8 MLS INTO THE SKIN ONCE ( 4 MLS FOR 30 DAYS) once a week) 10 mL 0  . omeprazole (PRILOSEC) 40 MG capsule Take 40 mg by mouth daily.    . traMADol (ULTRAM) 50 MG tablet Take 1 tablet (50 mg total) by  mouth at bedtime. (Patient taking differently: Take 50 mg by mouth. As needed) 30 tablet 2  . valsartan (DIOVAN) 160 MG tablet Take 160 mg by mouth daily.    . valsartan-hydrochlorothiazide (DIOVAN-HCT) 160-12.5 MG tablet     . venlafaxine (EFFEXOR) 75 MG tablet Take 75 mg by mouth once.     No current facility-administered medications on file prior to visit.         Objective:   Physical Exam Blood pressure (!) 146/90, pulse 64, temperature 98.3 F (36.8 C), height 5\' 5"  (1.651 m), weight 210 lb 4.8 oz (95.4 kg).  Alert and oriented. Skin warm and dry. Oral mucosa is moist.   . Sclera anicteric, conjunctivae is pink. Thyroid not enlarged. No cervical lymphadenopathy. Bilateral wheezes. Heart regular rate and rhythm.  Abdomen is soft. Bowel sounds are positive. No hepatomegaly. No abdominal masses felt. No tenderness.  No edema to lower extremities.  Assessment & Plan:  Cough, wheezes. I do not think this is acid reflux. Am going to get a chest xray. Will refer to Pulmonologist.

## 2016-12-24 NOTE — Patient Instructions (Signed)
Chest x-ray.

## 2016-12-27 ENCOUNTER — Telehealth (INDEPENDENT_AMBULATORY_CARE_PROVIDER_SITE_OTHER): Payer: Self-pay | Admitting: Internal Medicine

## 2016-12-27 DIAGNOSIS — R062 Wheezing: Secondary | ICD-10-CM

## 2016-12-27 NOTE — Telephone Encounter (Signed)
Modified barium swallow ordered .

## 2016-12-28 ENCOUNTER — Ambulatory Visit (INDEPENDENT_AMBULATORY_CARE_PROVIDER_SITE_OTHER): Payer: BC Managed Care – PPO | Admitting: Rheumatology

## 2016-12-28 ENCOUNTER — Encounter: Payer: Self-pay | Admitting: Rheumatology

## 2016-12-28 VITALS — BP 142/86 | HR 75 | Resp 13 | Wt 203.0 lb

## 2016-12-28 DIAGNOSIS — M7918 Myalgia, other site: Secondary | ICD-10-CM

## 2016-12-28 DIAGNOSIS — M25562 Pain in left knee: Secondary | ICD-10-CM | POA: Diagnosis not present

## 2016-12-28 DIAGNOSIS — G894 Chronic pain syndrome: Secondary | ICD-10-CM

## 2016-12-28 DIAGNOSIS — M0579 Rheumatoid arthritis with rheumatoid factor of multiple sites without organ or systems involvement: Secondary | ICD-10-CM

## 2016-12-28 DIAGNOSIS — Z79899 Other long term (current) drug therapy: Secondary | ICD-10-CM

## 2016-12-28 DIAGNOSIS — M19041 Primary osteoarthritis, right hand: Secondary | ICD-10-CM

## 2016-12-28 DIAGNOSIS — M19042 Primary osteoarthritis, left hand: Secondary | ICD-10-CM

## 2016-12-28 DIAGNOSIS — M791 Myalgia: Secondary | ICD-10-CM | POA: Diagnosis not present

## 2016-12-28 MED ORDER — LIDOCAINE HCL (PF) 1 % IJ SOLN
2.0000 mL | INTRAMUSCULAR | Status: AC | PRN
  Administered 2016-12-28: 2 mL

## 2016-12-28 MED ORDER — TRAMADOL HCL 50 MG PO TABS: 50.0000 mg | ORAL_TABLET | Freq: Every day | ORAL | 2 refills | Status: DC

## 2016-12-28 MED ORDER — TRIAMCINOLONE ACETONIDE 40 MG/ML IJ SUSP
40.0000 mg | INTRAMUSCULAR | Status: AC | PRN
  Administered 2016-12-28: 40 mg via INTRA_ARTICULAR

## 2016-12-28 NOTE — Progress Notes (Signed)
Office Visit Note  Patient: Shawna Williams Reason             Date of Birth: March 15, 1956           MRN: [id number]             PCP: Monico Blitz, MD Referring: Monico Blitz, MD Visit Date: 12/28/2016 Occupation: @GUAROCC @    Subjective:  No chief complaint on file.   History of Present Illness: Shawna Williams is a 61 y.o. female  Last seen in our office on 06/30/2016 for seropositive rheumatoid arthritis. Patient is on Enbrel every week, methotrexate 0.8 ML every week, folic acid 2 mg daily. Patient's last labs were February,16 2018. CBC with differential and CMP with GFR were normal TB go was negative. Patient recently went last Friday (June 2018) to [facility] internal medicine and had labs done at her PCPs office. They have been instructed to fax [place] those labs but we do not have copies of those yet. Patient does not know the results of those labs.  Today, patient reports that she is doing well with her rheumatoid arthritis. No joint pain, swelling, stiffness.  She is using her medication as prescribed and getting her lab work done per hour schedule.  Her main complaint today is her bilateral knees are bothering her. The left knee is worse than the right knee. Because of her blood pressure being mildly elevated but still in a good range right can give her the injection, she has selected the left knee being worse and for [place] to inject that.  Patient has had gel injections in the past and they have given her good relief. She states that it was Euflexxa injection to both knees. She finished her Euflex injection 11/12/2013 and has done well until now.  Activities of Daily Living:  Patient reports morning stiffness for 15 minutes.   Patient Denies nocturnal pain.  Difficulty dressing/grooming: Denies Difficulty climbing stairs: Denies Difficulty getting out of chair: Denies Difficulty using hands for taps, buttons, cutlery, and/or writing: Denies   Review of Systems  Constitutional: Negative for  fatigue.  HENT: Negative for mouth sores and mouth dryness.   Eyes: Negative for dryness.  Respiratory: Negative for shortness of breath.   Gastrointestinal: Negative for constipation and diarrhea.  Musculoskeletal: Negative for myalgias and myalgias.  Skin: Negative for sensitivity to sunlight.  Psychiatric/Behavioral: Negative for decreased concentration and sleep disturbance.    PMFS History:  Patient Active Problem List   Diagnosis Date Noted  . 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 Primary osteoarthritis of both hands 06/28/2016  . Primary osteoarthritis of both feet 06/28/2016  . Seropositive rheumatoid arthritis of multiple sites ([place]) 06/03/2016  . High risk medication use 06/03/2016  . Myofacial muscle pain 06/03/2016  . Other fatigue 06/03/2016  . Other insomnia 06/03/2016  . Chronic pain syndrome 06/03/2016    Past Medical History:  Diagnosis Date  . Arthritis    rhemuatoid  . Hypertension   . Rheumatoid arteritis 12/24/2016    Family History  Problem Relation Age of Onset  . Stroke Mother   . Diabetes Mother   . Heart attack Father   . Cancer Sister   . Diabetes Brother   . Diabetes Sister   . Heart attack Sister    Past Surgical History:  Procedure Laterality Date  . [place]  . COLONOSCOPY N/A 01/01/2016   Procedure: COLONOSCOPY;  Surgeon: Rogene Houston, MD;  [facility];  Service: Endoscopy;  Laterality: N/A;  930  . FOOT SURGERY Left    with screws  . TONSILLECTOMY     Social History   Social History Narrative  . No narrative on file     Objective: Vital Signs: BP (!) 142/86 (BP Location: Left Arm, Patient Position: Sitting, Cuff Size: Large)   Pulse 75   Resp 13   Wt 203 lb (92.1 kg)   BMI 33.78 kg/m    Physical Exam  Constitutional: She is oriented to person, place, and time. She appears well-developed and well-nourished.  HENT:  Head: Normocephalic and atraumatic.  Eyes: EOM are normal. Pupils are  equal, round, and reactive to light.  Cardiovascular: Normal rate, regular rhythm and normal heart sounds.  Exam reveals no gallop and no friction rub.   No murmur heard. Pulmonary/Chest: Effort normal and breath sounds normal. She has no wheezes. She has no rales.  Abdominal: Soft. Bowel sounds are normal. She exhibits no distension. There is no tenderness. There is no guarding. No hernia.  Musculoskeletal: Normal range of motion. She exhibits no edema, tenderness or deformity.  Lymphadenopathy:    She has no cervical adenopathy.  Neurological: She is alert and oriented to person, place, and time. Coordination normal.  Skin: Skin is warm and dry. Capillary refill takes less than 2 seconds. No rash noted.  Psychiatric: She has a normal mood and affect. Her behavior is normal.  Nursing note and vitals reviewed.    Musculoskeletal Exam:  Full range of motion of all joints Grip strength is equal and strong bilaterally For fibromyalgia tender points are  CDAI Exam: CDAI Homunculus Exam:   Joint Counts:  CDAI Tender Joint count: 0 CDAI Swollen Joint count: 0    Investigation: No additional findings. Orders Only on 09/03/2016  Component Date Value Ref Range Status  . Glucose 09/03/2016 101* 65 - 99 mg/dL Final  . BUN 09/03/2016 28* 8 - 27 mg/dL Final  . Creatinine, Ser 09/03/2016 0.98  0.57 - 1.00 mg/dL Final  . GFR calc non Af Amer 09/03/2016 63  >59 mL/min/1.73 Final  . GFR calc Af Amer 09/03/2016 73  >59 mL/min/1.73 Final  . BUN/Creatinine Ratio 09/03/2016 29* 12 - 28 Final  . Sodium 09/03/2016 141  134 - 144 mmol/L Final  . Potassium 09/03/2016 3.9  3.5 - 5.2 mmol/L Final  . Chloride 09/03/2016 99  96 - 106 mmol/L Final  . CO2 09/03/2016 22  18 - 29 mmol/L Final  . Calcium 09/03/2016 9.4  8.7 - 10.3 mg/dL Final  . Total Protein 09/03/2016 7.4  6.0 - 8.5 g/dL Final  . Albumin 09/03/2016 4.3  3.6 - 4.8 g/dL Final  . Globulin, Total 09/03/2016 3.1  1.5 - 4.5 g/dL Final  .  Albumin/Globulin Ratio 09/03/2016 1.4  1.2 - 2.2 Final  . Bilirubin Total 09/03/2016 0.4  0.0 - 1.2 mg/dL Final  . Alkaline Phosphatase 09/03/2016 88  39 - 117 IU/L Final  . AST 09/03/2016 23  0 - 40 IU/L Final  . ALT 09/03/2016 27  0 - 32 IU/L Final  . WBC 09/03/2016 8.3  3.4 - 10.8 x10E3/uL Final  . RBC 09/03/2016 3.95  3.77 - 5.28 x10E6/uL Final  . Hemoglobin 09/03/2016 12.2  11.1 - 15.9 g/dL Final  . Hematocrit 09/03/2016 37.0  34.0 - 46.6 % Final  . MCV 09/03/2016 94  79 - 97 fL Final  . MCH 09/03/2016 30.9  26.6 - 33.0 pg Final  . MCHC 09/03/2016 33.0  31.5 -  35.7 g/dL Final  . RDW 09/03/2016 14.1  12.3 - 15.4 % Final  . Platelets 09/03/2016 402* 150 - 379 x10E3/uL Final  . Neutrophils 09/03/2016 50  Not Estab. % Final  . Lymphs 09/03/2016 40  Not Estab. % Final  . Monocytes 09/03/2016 6  Not Estab. % Final  . Eos 09/03/2016 3  Not Estab. % Final  . Basos 09/03/2016 1  Not Estab. % Final  . Neutrophils Absolute 09/03/2016 4.2  1.4 - 7.0 x10E3/uL Final  . Lymphocytes Absolute 09/03/2016 3.3* 0.7 - 3.1 x10E3/uL Final  . Monocytes Absolute 09/03/2016 0.5  0.1 - 0.9 x10E3/uL Final  . EOS (ABSOLUTE) 09/03/2016 0.3  0.0 - 0.4 x10E3/uL Final  . Basophils Absolute 09/03/2016 0.0  0.0 - 0.2 x10E3/uL Final  . Immature Granulocytes 09/03/2016 0  Not Estab. % Final  . Immature Grans (Abs) 09/03/2016 0.0  0.0 - 0.1 x10E3/uL Final  . QUANTIFERON INCUBATION 09/03/2016 Comment   Final   Specimen incubated at [place], [place], [place].  [facility] QUANTIFERON TB GOLD 09/03/2016 Negative  Negative Final  . QUANTIFERON CRITERIA 09/03/2016 Comment   Final   Comment: To be considered positive a specimen should have a TB Ag minus Nil value greater than or equal to 0.35 IU/mL and in addition the TB Ag minus Nil value must be greater than or equal to 25% of the Nil value. There may be insufficient information in these values to differentiate between some negative and some indeterminate test values.   .  QUANTIFERON TB AG VALUE 09/03/2016 0.07  IU/mL Final  . Quantiferon Nil Value 09/03/2016 0.05  IU/mL Final  . QUANTIFERON MITOGEN VALUE 09/03/2016 5.49  IU/mL Final  . QFT TB AG MINUS NIL VALUE 09/03/2016 0.02  IU/mL Final  . Interpretation: 09/03/2016 Comment   Final   Comment: The QuantiFERON TB Gold (in Tube) assay is intended for use as an aid in the diagnosis of TB infection. Negative results suggest that there is no TB infection. In patients with high suspicion of exposure, a negative test should be repeated. A positive test indicates infection with Mycobacterium tuberculosis. Among individuals without tuberculosis infection, a positive test may be due to exposure to [place], M. szulgai or M. marinum. On the Internet, go to https://figueroa-lambert.info/ for further details.   Office Visit on 06/30/2016  Component Date Value Ref Range Status  . WBC 06/30/2016 9.1  3.8 - 10.8 K/uL Final  . RBC 06/30/2016 4.03  3.80 - 5.10 MIL/uL Final  . Hemoglobin 06/30/2016 12.6  11.7 - 15.5 g/dL Final  . HCT 06/30/2016 37.4  35.0 - 45.0 % Final  . MCV 06/30/2016 92.8  80.0 - 100.0 fL Final  . MCH 06/30/2016 31.3  27.0 - 33.0 pg Final  . MCHC 06/30/2016 33.7  32.0 - 36.0 g/dL Final  . RDW 06/30/2016 14.6  11.0 - 15.0 % Final  . Platelets 06/30/2016 341  140 - 400 K/uL Final  . MPV 06/30/2016 10.4  7.5 - 12.5 fL Final  . Neutro Abs 06/30/2016 4732  1,500 - 7,800 cells/uL Final  . Lymphs Abs 06/30/2016 3276  850 - 3,900 cells/uL Final  . Monocytes Absolute 06/30/2016 637  200 - 950 cells/uL Final  . Eosinophils Absolute 06/30/2016 364  15 - 500 cells/uL Final  . Basophils Absolute 06/30/2016 91  0 - 200 cells/uL Final  . Neutrophils Relative % 06/30/2016 52  % Final  . Lymphocytes Relative 06/30/2016 36  % Final  . Monocytes Relative 06/30/2016  7  % Final  . Eosinophils Relative 06/30/2016 4  % Final  . Basophils Relative 06/30/2016 1  % Final  . Smear Review 06/30/2016 Criteria for review not met   Final    . Sodium 06/30/2016 139  135 - 146 mmol/L Final  . Potassium 06/30/2016 3.9  3.5 - 5.3 mmol/L Final  . Chloride 06/30/2016 106  98 - 110 mmol/L Final  . CO2 06/30/2016 23  20 - 31 mmol/L Final  . Glucose, Bld 06/30/2016 99  65 - 99 mg/dL Final  . BUN 06/30/2016 22  7 - 25 mg/dL Final  . Creat 06/30/2016 0.92  0.50 - 0.99 mg/dL Final   Comment:   For patients > or = 61 years of age: The upper reference limit for Creatinine is approximately 13% higher for people identified as African-American.     . Total Bilirubin 06/30/2016 0.6  0.2 - 1.2 mg/dL Final  . Alkaline Phosphatase 06/30/2016 91  33 - 130 U/L Final  . AST 06/30/2016 20  10 - 35 U/L Final  . ALT 06/30/2016 21  6 - 29 U/L Final  . Total Protein 06/30/2016 7.5  6.1 - 8.1 g/dL Final  . Albumin 06/30/2016 4.2  3.6 - 5.1 g/dL Final  . Calcium 06/30/2016 9.5  8.6 - 10.4 mg/dL Final     Imaging: Dg Chest 2 View  Result Date: 12/24/2016 CLINICAL DATA:  Cough, wheezing EXAM: CHEST  2 VIEW COMPARISON:  None. FINDINGS: Heart and mediastinal contours are within normal limits. No focal opacities or effusions. No acute bony abnormality. IMPRESSION: No active cardiopulmonary disease. Electronically Signed   By: Rolm Baptise M.D.   On: 12/24/2016 09:45    Speciality Comments: No specialty comments available.    Procedures:  Large Joint Inj Date/Time: 12/28/2016 1:03 PM Performed by: Eliezer Lofts Authorized by: Eliezer Lofts   Consent Given by:  Patient Site marked: the procedure site was marked   Timeout: prior to procedure the correct patient, procedure, and site was verified   Indications:  Pain and joint swelling Location:  Knee Site:  L knee Prep: patient was prepped and draped in usual sterile fashion   Needle Size:  27 G Needle Length:  1.5 inches Approach:  Medial Ultrasound Guidance: No   Fluoroscopic Guidance: No   Arthrogram: No   Medications:  40 mg triamcinolone acetonide 40 MG/ML; 2 mL lidocaine (PF) 1  % Aspiration Attempted: Yes   Aspirate amount (mL):  0 Patient tolerance:  Patient tolerated the procedure well with no immediate complications   Patient was pain-free after the injection. She had Euflex injection back in 2015 and did really well. She would like to start those injections once again. She wants [place] to apply.   Allergies: Diltiazem; Ketorolac; and Codeine   Assessment / Plan:     Visit Diagnoses: Seropositive rheumatoid arthritis of multiple sites ([place])  High risk medication use  Myofacial muscle pain  Chronic pain syndrome  Primary osteoarthritis of both hands   Message sent to [facility] for application for [place] supplementation (Flexeril or Hyalgan or warmth of this will be acceptable).  Patient had labs done recently at [facility] internal medicine and she will get [place] a copy forwarded to [place].  Her TB gold was negative as of February 2018. CBC with differential and CMP with GFR from February 2018 was also normal   Orders: No orders of the defined types were placed in this encounter.  Meds ordered this encounter  Medications  .  traMADol (ULTRAM) 50 MG tablet    Sig: Take 1 tablet (50 mg total) by mouth at bedtime.    Dispense:  30 tablet    Refill:  2    Order Specific Question:   Supervising Provider    Answer:   Bo Merino [phone]    Face-to-face time spent with patient was 30 minutes. 50% of time was spent in counseling and coordination of care.  Follow-Up Instructions: Return in about 5 months (around 05/30/2017).   Eliezer Lofts, PA-C  Note - This record has been created using [employer].  Chart creation errors have been sought, but may not always  have been located. Such creation errors do not reflect on  the standard of medical care.

## 2016-12-29 ENCOUNTER — Other Ambulatory Visit (HOSPITAL_COMMUNITY): Payer: Self-pay | Admitting: Specialist

## 2016-12-29 DIAGNOSIS — R062 Wheezing: Secondary | ICD-10-CM

## 2017-01-03 ENCOUNTER — Telehealth: Payer: Self-pay | Admitting: *Deleted

## 2017-01-03 NOTE — Telephone Encounter (Signed)
Labs received from PCP. Drawn on 12/24/16. Reviewed by Mr. Leane Call CBC WNL Increased Lymphs Absolute (will monitor)  CMP WNL

## 2017-01-04 ENCOUNTER — Ambulatory Visit (HOSPITAL_COMMUNITY)
Admission: RE | Admit: 2017-01-04 | Discharge: 2017-01-04 | Disposition: A | Discharge status: Home / Self Care | Payer: BC Managed Care – PPO | Source: Ambulatory Visit | Attending: Internal Medicine | Admitting: Internal Medicine

## 2017-01-04 ENCOUNTER — Encounter (HOSPITAL_COMMUNITY): Payer: Self-pay | Admitting: Speech Pathology

## 2017-01-04 ENCOUNTER — Ambulatory Visit (HOSPITAL_COMMUNITY): Payer: BC Managed Care – PPO | Attending: Internal Medicine | Admitting: Speech Pathology

## 2017-01-04 DIAGNOSIS — R062 Wheezing: Secondary | ICD-10-CM

## 2017-01-04 DIAGNOSIS — M069 Rheumatoid arthritis, unspecified: Secondary | ICD-10-CM | POA: Insufficient documentation

## 2017-01-04 DIAGNOSIS — R1312 Dysphagia, oropharyngeal phase: Secondary | ICD-10-CM | POA: Insufficient documentation

## 2017-01-04 DIAGNOSIS — K219 Gastro-esophageal reflux disease without esophagitis: Secondary | ICD-10-CM | POA: Insufficient documentation

## 2017-01-04 DIAGNOSIS — R05 Cough: Secondary | ICD-10-CM | POA: Diagnosis not present

## 2017-01-04 NOTE — Therapy (Signed)
[facility] [address] [place], [place], [address] Phone: [phone]   Fax:  [phone]  Modified Barium Swallow  Patient Details  Name: Shawna Williams MRN: [id number] Date of Birth: [birth year] No Data Recorded  Encounter Date: 01/04/2017      End of Session - 01/04/17 1547    Visit Number 1   Number of Visits 1   Authorization Type BCBS State health   SLP Start Time 1304   SLP Stop Time  1340   SLP Time Calculation (min) 36 min   Activity Tolerance Patient tolerated treatment well      Past Medical History:  Diagnosis Date  . Arthritis    rhemuatoid  . Hypertension   . 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 CESAREAN SECTION  1981 and 1985  . COLONOSCOPY N/A 01/01/2016   Procedure: COLONOSCOPY;  Surgeon: Malissa Hippo, MD;  [facility];  Service: Endoscopy;  Laterality: N/A;  930  . FOOT SURGERY Left    with screws  . TONSILLECTOMY      There were no vitals filed for this visit.      Subjective Assessment - 01/04/17 1544    Subjective "I have had a cough since about October."   Special Tests MBSS   Currently in Pain? No/denies             General - 01/04/17 1545      General Information   Date of Onset 04/18/16   HPI Shawna Williams is a 61 yo female who was referred for MBSS by Dorene Ar (GI) due to Pt c/o chronic cough since October.    Type of Study MBS-Modified Barium Swallow Study   Diet Prior to this Study Regular;Thin liquids   Temperature Spikes Noted No   Respiratory Status Room air   History of Recent Intubation No   Behavior/Cognition Alert;Cooperative;Pleasant mood   Oral Cavity Assessment Within Functional Limits   Oral Care Completed by SLP No   Oral Cavity - Dentition Adequate natural dentition   Vision Functional for self feeding   Self-Feeding Abilities Able to feed self   Patient Positioning Upright in chair   Baseline  Vocal Quality Normal   Volitional Cough Strong   Volitional Swallow Able to elicit   Anatomy Within functional limits   Pharyngeal Secretions Not observed secondary MBS            Oral Preparation/Oral Phase - 01/04/17 1546      Oral Preparation/Oral Phase   Oral Phase Within functional limits     Electrical stimulation - Oral Phase   Was Electrical Stimulation Used No          Pharyngeal Phase - 01/04/17 1547      Pharyngeal Phase   Pharyngeal Phase Within functional limits     Electrical Stimulation - Pharyngeal Phase   Was Electrical Stimulation Used No          Cricopharyngeal Phase - 01/04/17 1547      Cervical Esophageal Phase   Cervical Esophageal Phase Within functional limits            Plan - 01/04/17 1548    Clinical Impression Statement Shawna Williams presents with normal oropharyngeal swallow function when assessed with barium tinged thin via cup and straw, puree, regular textures, and barium tablet. Swallow initiation WFL, no penetration/aspiration observed, and no residuals post swallow. Esophageal sweep demonstrated passage of barium tablet  to stomach when taken with thin barium. Do not feel chronic cough is related to dysphagia. Pt does report h/o reflux and takes medication once in the AM. Consider referral to ENT. Pt encouraged to elevate the head of her bed and implement reflux precautions to see if this helps reduce coughing. Incidentally, the patient had cortisone injections to knees last week and she feels cough may be a little better. Could also consider splitting or taking PPI two times per day if MD recommends to see if this alleviates symptoms. No further SLP services indicated at this time.    Consulted and Agree with Plan of Care Patient      Patient will benefit from skilled therapeutic intervention in order to improve the following deficits and impairments:   Dysphagia, oropharyngeal phase        Recommendations/Treatment -  01/04/17 1547      Swallow Evaluation Recommendations   Recommended Consults Consider ENT evaluation   SLP Diet Recommendations Age appropriate regular;Thin   Liquid Administration via Cup;Straw   Medication Administration Whole meds with liquid   Supervision Patient able to self feed   Postural Changes Seated upright at 90 degrees;Remain upright for at least 30 minutes after feeds/meals          Prognosis - 01/04/17 1547      Prognosis   Prognosis for Safe Diet Advancement Good     Individuals Consulted   Consulted and Agree with Results and Recommendations Patient   Report Sent to  Referring physician      Problem List Patient Active Problem List   Diagnosis Date Noted  . 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 Primary osteoarthritis of both hands 06/28/2016  . Primary osteoarthritis of both feet 06/28/2016  . 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 High risk medication use 06/03/2016  . Myofacial muscle pain 06/03/2016  . Other fatigue 06/03/2016  . Other insomnia 06/03/2016  . Chronic pain syndrome 06/03/2016   Thank you,  Havery Moros, CCC-SLP [phone]  [facility] 01/04/2017, 3:54 PM  [facility] [address] [place], [place], [address] Phone: [phone]   Fax:  [phone]  Name: Shawna Williams MRN: [id number] Date of Birth: August 13, 1955

## 2017-01-06 ENCOUNTER — Ambulatory Visit: Payer: BC Managed Care – PPO | Admitting: Rheumatology

## 2017-01-17 ENCOUNTER — Other Ambulatory Visit: Payer: Self-pay | Admitting: Rheumatology

## 2017-01-17 NOTE — Telephone Encounter (Signed)
Ok to refill per Dr Deveshwar  

## 2017-01-17 NOTE — Telephone Encounter (Signed)
Last Visit: 12/28/16 Next Visit 05/31/17  Okay to refill Trazodone?

## 2017-01-21 ENCOUNTER — Telehealth: Payer: Self-pay | Admitting: *Deleted

## 2017-01-21 NOTE — Telephone Encounter (Signed)
Per patient's insurance, patient must use another provider for Euflexxa inj. I called patient and gave # [phone] for infornation.

## 2017-01-25 ENCOUNTER — Telehealth: Payer: Self-pay | Admitting: Rheumatology

## 2017-01-25 NOTE — Telephone Encounter (Signed)
Christine from [facility] called to follow up on a prior authorization for the patient's Euflexxa.  This does require a letter of necessity.  Thank you.  CB#[phone].  Thank you.

## 2017-03-03 ENCOUNTER — Other Ambulatory Visit: Payer: Self-pay | Admitting: Rheumatology

## 2017-03-03 NOTE — Telephone Encounter (Signed)
12/28/16 last visit  05/31/17 next visit  Labs 12/24/16 in Media CBC CMP WNL TB gold negative In Feb 2018 Ok to refill per Dr Corliss Skains

## 2017-03-17 NOTE — Telephone Encounter (Signed)
In progress

## 2017-04-21 ENCOUNTER — Other Ambulatory Visit: Payer: Self-pay | Admitting: Rheumatology

## 2017-04-21 NOTE — Telephone Encounter (Signed)
ok 

## 2017-04-21 NOTE — Telephone Encounter (Signed)
12/28/16 last visit  05/31/17 next visit  Okay to refill Trazodone?

## 2017-05-17 ENCOUNTER — Other Ambulatory Visit: Payer: Self-pay | Admitting: Rheumatology

## 2017-05-17 DIAGNOSIS — Z79899 Other long term (current) drug therapy: Secondary | ICD-10-CM

## 2017-05-17 NOTE — Telephone Encounter (Signed)
12/28/16 last visit  05/31/17 next visit Labs: 12/24/16 WNL  Patient to update labs this week.   Okay to refill 30 day supply per Dr. Corliss Skains

## 2017-05-19 NOTE — Progress Notes (Signed)
Office Visit Note  Patient: Shawna Williams             Date of Birth: 17-Jul-1956           MRN: [id number]             PCP: Kirstie Peri, MD Referring: Kirstie Peri, MD Visit Date: 05/31/2017 Occupation: @GUAROCC @    Subjective:  Medication management   History of Present Illness: Shawna Williams is a 61 y.o. female with history of sero positive rheumatoid arthritis. She states after her last visit she decided to taper  methotrexate and has reduced  methotrexate from 0.8 ml to  0.53mL subcutaneous every week. She has not noticed any flare of her symptoms. She continues to have some discomfort from underlying osteoarthritis. She has some generalized pain from fibromyalgia.  Activities of Daily Living:  Patient reports morning stiffness for 30 minutes.   Patient Denies nocturnal pain.  Difficulty dressing/grooming: Denies Difficulty climbing stairs: Reports Difficulty getting out of chair: Reports Difficulty using hands for taps, buttons, cutlery, and/or writing: Denies   Review of Systems  Constitutional: Negative for fatigue, night sweats, weight gain, weight loss and weakness.  HENT: Negative for mouth sores, trouble swallowing, trouble swallowing, mouth dryness and nose dryness.   Eyes: Negative for pain, redness, visual disturbance and dryness.  Respiratory: Negative for cough, shortness of breath and difficulty breathing.   Cardiovascular: Negative for chest pain, palpitations, hypertension, irregular heartbeat and swelling in legs/feet.  Gastrointestinal: Negative for blood in stool, constipation and diarrhea.  Endocrine: Negative for increased urination.  Genitourinary: Negative for vaginal dryness.  Musculoskeletal: Positive for arthralgias, joint pain and morning stiffness. Negative for joint swelling, myalgias, muscle weakness, muscle tenderness and myalgias.  Skin: Negative for color change, rash, hair loss, skin tightness, ulcers and sensitivity to sunlight.    Allergic/Immunologic: Negative for susceptible to infections.  Neurological: Negative for dizziness, memory loss and night sweats.  Hematological: Negative for swollen glands.  Psychiatric/Behavioral: Positive for depressed mood and sleep disturbance. The patient is not nervous/anxious.     PMFS History:  Patient Active Problem List   Diagnosis Date Noted  . 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 Primary osteoarthritis of both hands 06/28/2016  . Primary osteoarthritis of both feet 06/28/2016  . 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 High risk medication use 06/03/2016  . Myofacial muscle pain 06/03/2016  . Other fatigue 06/03/2016  . Other insomnia 06/03/2016  . Chronic pain syndrome 06/03/2016    Past Medical History:  Diagnosis Date  . Arthritis    rhemuatoid  . Hypertension   . Rheumatoid arteritis 12/24/2016    Family History  Problem Relation Age of Onset  . Stroke Mother   . Diabetes Mother   . Heart attack Father   . Cancer Sister   . Diabetes Brother   . Diabetes Sister   . Heart attack Sister    Past Surgical History:  Procedure Laterality Date  . CESAREAN SECTION  1981 and 1985  . FOOT SURGERY Left    with screws  . TONSILLECTOMY     Social History   Social History Narrative  . Not on file     Objective: Vital Signs: BP 125/83 (BP Location: Left Arm, Patient Position: Sitting, Cuff Size: Normal)   Pulse 79   Ht 5\' 6"  (1.676 m)   Wt 207 lb (93.9 kg)   BMI 33.41 kg/m    Physical Exam  Constitutional: She is oriented to person, place,  and time. She appears well-developed and well-nourished.  HENT:  Head: Normocephalic and atraumatic.  Eyes: Conjunctivae and EOM are normal.  Neck: Normal range of motion.  Cardiovascular: Normal rate, regular rhythm, normal heart sounds and intact distal pulses.  Pulmonary/Chest: Effort normal and breath sounds normal.  Abdominal: Soft. Bowel sounds are normal.  Lymphadenopathy:     She has no cervical adenopathy.  Neurological: She is alert and oriented to person, place, and time.  Skin: Skin is warm and dry. Capillary refill takes less than 2 seconds.  Psychiatric: She has a normal mood and affect. Her behavior is normal.  Nursing note and vitals reviewed.    Musculoskeletal Exam: C-spine and thoracic lumbar spine good range of motion. Shoulder joints elbow joints wrist joint MCPs PIPs DIPs with good range of motion. She has DIP PIP thickening consistent with osteoarthritis. She had no synovitis on examination today. Hip joints knee joints ankles MTPs PIPs with good range of motion with no synovitis.  CDAI Exam: CDAI Homunculus Exam:   Joint Counts:  CDAI Tender Joint count: 0 CDAI Swollen Joint count: 0  Global Assessments:  Patient Global Assessment: 5 Provider Global Assessment: 3  CDAI Calculated Score: 8    Investigation: No additional findings.TB Gold: 09/03/2016 Negative  CBC Latest Ref Rng & Units 05/20/2017 09/03/2016 06/30/2016  WBC 3.8 - 10.8 Thousand/uL 5.6 8.3 9.1  Hemoglobin 11.7 - 15.5 g/dL 31.4 97.0 26.3  Hematocrit 35.0 - 45.0 % 40.1 37.0 37.4  Platelets 140 - 400 Thousand/uL 340 402(H) 341   CMP Latest Ref Rng & Units 05/20/2017 09/03/2016 06/30/2016  Glucose 65 - 139 mg/dL 785 885(O) 99  BUN 7 - 25 mg/dL 19 27(X) 22  Creatinine 0.50 - 0.99 mg/dL 4.12 8.78 6.76  Sodium 135 - 146 mmol/L 140 141 139  Potassium 3.5 - 5.3 mmol/L 4.1 3.9 3.9  Chloride 98 - 110 mmol/L 106 99 106  CO2 20 - 32 mmol/L 25 22 23   Calcium 8.6 - 10.4 mg/dL 9.3 9.4 9.5  Total Protein 6.1 - 8.1 g/dL 7.4 7.4 7.5  Total Bilirubin 0.2 - 1.2 mg/dL 0.5 0.4 0.6  Alkaline Phos 39 - 117 IU/L - 88 91  AST 10 - 35 U/L 18 23 20   ALT 6 - 29 U/L 18 27 21     Imaging: No results found.  Speciality Comments: No specialty comments available.    Procedures:  No procedures performed Allergies: Diltiazem; Ketorolac; and Codeine   Assessment / Plan:     Visit Diagnoses:  Rheumatoid arthritis involving multiple sites with positive rheumatoid factor (HCC) - +Rf, +CCP, erosive . Patient has no synovitis on examination she is clinically doing very well. She has tapered her prednisone down to 0.4 mL subcutaneous every week. We had detailed discussion regarding her regimen. I've advised her to a spacer Enbrel to every 10 days and if tolerated she can probably space Enbrel to 14 days.  High risk medication use - Enbrel 50 mg sq q week, MTX 0.4 ml sq q wk , folic acid 1 mg po qd -her labs have been stable. We will check labs every 3 months.Plan: CBC with Differential/Platelet, COMPLETE METABOLIC PANEL WITH GFR, Pain Mgmt, Profile 5 w/Conf, U, Pain Mgmt, Tramadol w/medMATCH, U, Quantiferon tb gold assay (blood)on a yearly basis.  Primary osteoarthritis of both hands: She has some stiffness and discomfort.  Primary osteoarthritis of both feet: Proper fitting shoes were discussed.  Myofascial pain syndrome: She reports occasional flares.  Other chronic pain -  Tramadol 50 mg by mouth daily at bedtime when necessary for  severe pain which she takes for fibromyalgia flares.we will check UDS today and also renew narcotic agreement.  Her other medical problems are listed as follows:  History of asthma  History of migraine  History of depression  History of hypertension    Orders: Orders Placed This Encounter  Procedures  . CBC with Differential/Platelet  . COMPLETE METABOLIC PANEL WITH GFR  . Pain Mgmt, Profile 5 w/Conf, U  . Pain Mgmt, Tramadol w/medMATCH, U  . Quantiferon tb gold assay (blood)   No orders of the defined types were placed in this encounter.   Face-to-face time spent with patient was 30 minutes.  Greater than 50% of time was spent in counseling and coordination of care.  Follow-Up Instructions: Return in about 5 months (around 10/29/2017) for Rheumatoid arthritis, Osteoarthritis,MFPS.   Pollyann Savoy, MD  Note - This record has been  created using Animal nutritionist.  Chart creation errors have been sought, but may not always  have been located. Such creation errors do not reflect on  the standard of medical care.

## 2017-05-20 ENCOUNTER — Other Ambulatory Visit: Payer: Self-pay | Admitting: Rheumatology

## 2017-05-20 LAB — CBC WITH DIFFERENTIAL/PLATELET
Basophils Absolute: 50 cells/uL (ref 0–200)
Basophils Relative: 0.9 %
Eosinophils Absolute: 196 cells/uL (ref 15–500)
Eosinophils Relative: 3.5 %
HCT: 40.1 % (ref 35.0–45.0)
Hemoglobin: 13.5 g/dL (ref 11.7–15.5)
Lymphs Abs: 2593 cells/uL (ref 850–3900)
MCH: 30.4 pg (ref 27.0–33.0)
MCHC: 33.7 g/dL (ref 32.0–36.0)
MCV: 90.3 fL (ref 80.0–100.0)
MPV: 11.1 fL (ref 7.5–12.5)
Monocytes Relative: 7.8 %
Neutro Abs: 2324 cells/uL (ref 1500–7800)
Neutrophils Relative %: 41.5 %
Platelets: 340 10*3/uL (ref 140–400)
RBC: 4.44 10*6/uL (ref 3.80–5.10)
RDW: 12.1 % (ref 11.0–15.0)
Total Lymphocyte: 46.3 %
WBC mixed population: 437 cells/uL (ref 200–950)
WBC: 5.6 10*3/uL (ref 3.8–10.8)

## 2017-05-20 LAB — COMPLETE METABOLIC PANEL WITH GFR
AG Ratio: 1.5 (calc) (ref 1.0–2.5)
ALT: 18 U/L (ref 6–29)
AST: 18 U/L (ref 10–35)
Albumin: 4.4 g/dL (ref 3.6–5.1)
Alkaline phosphatase (APISO): 87 U/L (ref 33–130)
BUN: 19 mg/dL (ref 7–25)
CO2: 25 mmol/L (ref 20–32)
Calcium: 9.3 mg/dL (ref 8.6–10.4)
Chloride: 106 mmol/L (ref 98–110)
Creat: 0.89 mg/dL (ref 0.50–0.99)
GFR, Est African American: 81 mL/min/{1.73_m2} (ref >=60)
GFR, Est Non African American: 70 mL/min/{1.73_m2} (ref >=60)
Globulin: 3 g/dL (calc) (ref 1.9–3.7)
Glucose, Bld: 106 mg/dL (ref 65–139)
Potassium: 4.1 mmol/L (ref 3.5–5.3)
Sodium: 140 mmol/L (ref 135–146)
Total Bilirubin: 0.5 mg/dL (ref 0.2–1.2)
Total Protein: 7.4 g/dL (ref 6.1–8.1)

## 2017-05-22 NOTE — Progress Notes (Signed)
WNL

## 2017-05-31 ENCOUNTER — Ambulatory Visit: Payer: BC Managed Care – PPO | Admitting: Rheumatology

## 2017-05-31 ENCOUNTER — Encounter: Payer: Self-pay | Admitting: Rheumatology

## 2017-05-31 ENCOUNTER — Encounter (INDEPENDENT_AMBULATORY_CARE_PROVIDER_SITE_OTHER): Payer: Self-pay

## 2017-05-31 VITALS — BP 125/83 | HR 79 | Ht 66.0 in | Wt 207.0 lb

## 2017-05-31 DIAGNOSIS — M19071 Primary osteoarthritis, right ankle and foot: Secondary | ICD-10-CM | POA: Diagnosis not present

## 2017-05-31 DIAGNOSIS — M0579 Rheumatoid arthritis with rheumatoid factor of multiple sites without organ or systems involvement: Secondary | ICD-10-CM | POA: Diagnosis not present

## 2017-05-31 DIAGNOSIS — M19041 Primary osteoarthritis, right hand: Secondary | ICD-10-CM | POA: Diagnosis not present

## 2017-05-31 DIAGNOSIS — Z8709 Personal history of other diseases of the respiratory system: Secondary | ICD-10-CM | POA: Diagnosis not present

## 2017-05-31 DIAGNOSIS — G8929 Other chronic pain: Secondary | ICD-10-CM

## 2017-05-31 DIAGNOSIS — Z79899 Other long term (current) drug therapy: Secondary | ICD-10-CM | POA: Diagnosis not present

## 2017-05-31 DIAGNOSIS — M7918 Myalgia, other site: Secondary | ICD-10-CM

## 2017-05-31 DIAGNOSIS — Z8659 Personal history of other mental and behavioral disorders: Secondary | ICD-10-CM | POA: Diagnosis not present

## 2017-05-31 DIAGNOSIS — M19072 Primary osteoarthritis, left ankle and foot: Secondary | ICD-10-CM

## 2017-05-31 DIAGNOSIS — M19042 Primary osteoarthritis, left hand: Secondary | ICD-10-CM

## 2017-05-31 DIAGNOSIS — Z8669 Personal history of other diseases of the nervous system and sense organs: Secondary | ICD-10-CM

## 2017-05-31 DIAGNOSIS — Z8679 Personal history of other diseases of the circulatory system: Secondary | ICD-10-CM | POA: Diagnosis not present

## 2017-05-31 MED ORDER — TRAMADOL HCL 50 MG PO TABS: 50.0000 mg | ORAL_TABLET | Freq: Every day | ORAL | 2 refills | Status: DC

## 2017-05-31 NOTE — Patient Instructions (Signed)
Standing Labs We placed an order today for your standing lab work.    Please come back and get your standing labs in February and every 3 months TB Gold is due in February  We have open lab Monday through Friday from 8:30-11:30 AM and 1:30-4 PM at the office of Dr. Sawsan Riggio.   The office is located at [address], [address], [place], [place] [address] No appointment is necessary.   Labs are drawn by [facility].  You may receive a bill from [facility] for your lab work. If you have any questions regarding directions or hours of operation,  please call [phone].    

## 2017-06-02 NOTE — Progress Notes (Signed)
C/w

## 2017-06-03 LAB — PAIN MGMT, PROFILE 5 W/CONF, U
Amphetamines: NEGATIVE ng/mL (ref <500)
Barbiturates: NEGATIVE ng/mL (ref <300)
Benzodiazepines: NEGATIVE ng/mL (ref <100)
Cocaine Metabolite: NEGATIVE ng/mL (ref <150)
Creatinine: 24.6 mg/dL
Marijuana Metabolite: NEGATIVE ng/mL (ref <20)
Methadone Metabolite: NEGATIVE ng/mL (ref <100)
Opiates: NEGATIVE ng/mL (ref <100)
Oxidant: NEGATIVE ug/mL (ref <200)
Oxycodone: NEGATIVE ng/mL (ref <100)
pH: 5.25 (ref 4.5–9.0)

## 2017-06-03 LAB — PAIN MGMT, TRAMADOL W/MEDMATCH, U
Desmethyltramadol: NEGATIVE ng/mL (ref <100)
Tramadol: NEGATIVE ng/mL (ref <100)

## 2017-06-21 ENCOUNTER — Other Ambulatory Visit: Payer: Self-pay | Admitting: Rheumatology

## 2017-06-21 NOTE — Telephone Encounter (Signed)
Last Visit: 05/31/17 Next Visit: 11/03/17   Okay to refill per Dr. Corliss Skains

## 2017-09-14 ENCOUNTER — Other Ambulatory Visit: Payer: Self-pay | Admitting: Rheumatology

## 2017-09-14 DIAGNOSIS — Z79899 Other long term (current) drug therapy: Secondary | ICD-10-CM

## 2017-09-15 ENCOUNTER — Institutional Professional Consult (permissible substitution): Payer: BC Managed Care – PPO | Admitting: Internal Medicine

## 2017-09-16 NOTE — Telephone Encounter (Signed)
Last Visit: 05/31/17 Next Visit: 11/03/17 Labs: 05/20/17 WNL TB Gold: 09/03/16 Neg   Patient advised she is due to update labs. Patient will update labs today.  Okay to refill 30 day supply per Dr. Corliss Skains

## 2017-09-19 ENCOUNTER — Other Ambulatory Visit: Payer: Self-pay | Admitting: Rheumatology

## 2017-09-19 NOTE — Telephone Encounter (Signed)
Last Visit: 05/31/17 Next Visit: 11/03/17 UDS: 05/31/17 Narc Agreement: 05/31/17  Okay to refill Tramadol?

## 2017-09-19 NOTE — Telephone Encounter (Signed)
ok 

## 2017-09-22 ENCOUNTER — Other Ambulatory Visit: Payer: Self-pay | Admitting: *Deleted

## 2017-09-22 MED ORDER — TRAZODONE HCL 50 MG PO TABS: 50.0000 mg | ORAL_TABLET | Freq: Every day | ORAL | 0 refills | Status: DC

## 2017-09-22 NOTE — Telephone Encounter (Signed)
Refill request received via fax  Last Visit: 05/31/17 Next Visit: 11/03/17  Okay to refill Trazadone?

## 2017-09-22 NOTE — Telephone Encounter (Signed)
Ok to refill 

## 2017-09-23 LAB — COMPLETE METABOLIC PANEL WITH GFR
AG Ratio: 1.5 (calc) (ref 1.0–2.5)
ALT: 22 U/L (ref 6–29)
AST: 19 U/L (ref 10–35)
Albumin: 4.1 g/dL (ref 3.6–5.1)
Alkaline phosphatase (APISO): 70 U/L (ref 33–130)
BUN/Creatinine Ratio: 20 (calc) (ref 6–22)
BUN: 21 mg/dL (ref 7–25)
CO2: 24 mmol/L (ref 20–32)
Calcium: 9.6 mg/dL (ref 8.6–10.4)
Chloride: 105 mmol/L (ref 98–110)
Creat: 1.04 mg/dL — ABNORMAL HIGH (ref 0.50–0.99)
GFR, Est African American: 67 mL/min/{1.73_m2} (ref >=60)
GFR, Est Non African American: 58 mL/min/{1.73_m2} — ABNORMAL LOW (ref >=60)
Globulin: 2.7 g/dL (calc) (ref 1.9–3.7)
Glucose, Bld: 120 mg/dL — ABNORMAL HIGH (ref 65–99)
Potassium: 4 mmol/L (ref 3.5–5.3)
Sodium: 139 mmol/L (ref 135–146)
Total Bilirubin: 0.6 mg/dL (ref 0.2–1.2)
Total Protein: 6.8 g/dL (ref 6.1–8.1)

## 2017-09-23 LAB — CBC WITH DIFFERENTIAL/PLATELET
Basophils Absolute: 48 cells/uL (ref 0–200)
Basophils Relative: 0.8 %
Eosinophils Absolute: 510 cells/uL — ABNORMAL HIGH (ref 15–500)
Eosinophils Relative: 8.5 %
HCT: 37.6 % (ref 35.0–45.0)
Hemoglobin: 12.8 g/dL (ref 11.7–15.5)
Lymphs Abs: 2088 cells/uL (ref 850–3900)
MCH: 31.2 pg (ref 27.0–33.0)
MCHC: 34 g/dL (ref 32.0–36.0)
MCV: 91.7 fL (ref 80.0–100.0)
MPV: 11.1 fL (ref 7.5–12.5)
Monocytes Relative: 9 %
Neutro Abs: 2814 cells/uL (ref 1500–7800)
Neutrophils Relative %: 46.9 %
Platelets: 342 10*3/uL (ref 140–400)
RBC: 4.1 10*6/uL (ref 3.80–5.10)
RDW: 12.6 % (ref 11.0–15.0)
Total Lymphocyte: 34.8 %
WBC mixed population: 540 cells/uL (ref 200–950)
WBC: 6 10*3/uL (ref 3.8–10.8)

## 2017-09-24 LAB — QUANTIFERON-TB GOLD PLUS
Mitogen-NIL: >10 IU/mL
NIL: 0.04 IU/mL
QuantiFERON-TB Gold Plus: NEGATIVE
TB1-NIL: 0 IU/mL
TB2-NIL: <0 IU/mL

## 2017-10-03 ENCOUNTER — Other Ambulatory Visit: Payer: Self-pay | Admitting: *Deleted

## 2017-10-03 MED ORDER — DICLOFENAC SODIUM 1 % TD GEL: TRANSDERMAL | 3 refills | Status: DC

## 2017-10-03 NOTE — Telephone Encounter (Signed)
Refill request received via fax   Last Visit: 05/31/17 Next Visit: 11/03/17  Okay to refill per Dr. Corliss Skains

## 2017-10-05 ENCOUNTER — Telehealth: Payer: Self-pay

## 2017-10-05 NOTE — Telephone Encounter (Signed)
Received a prior authorization request for Voltaren Gel from [facility]. The authorization through cover my meds asks if patient has an intolerance or contraindication to oral nsaids. Called pt to verify. She states that she does not like taking oral nsaids because of stomach upset and the effects it has on the liver.   Completed the authorization through cover my meds. Will update once we receive a response.   Myking Sar, [place], CPhT 10:26 AM

## 2017-10-06 NOTE — Telephone Encounter (Signed)
Received a fax from [facility] regarding a prior authorization approval for DICLOFENAC GEL from 10/05/2017 to 10/05/2020.   Reference number:[id number] Phone number: [phone]  Will send document to scan center.  Called pt to update. Patient voices understanding and denies any questions at this time.  Mickell Birdwell, [place], CPhT 10:45 AM

## 2017-10-14 ENCOUNTER — Encounter: Payer: Self-pay | Admitting: Cardiovascular Disease

## 2017-10-14 LAB — PULMONARY FUNCTION TEST

## 2017-10-20 ENCOUNTER — Other Ambulatory Visit: Payer: Self-pay | Admitting: Physician Assistant

## 2017-10-20 NOTE — Telephone Encounter (Signed)
ok 

## 2017-10-20 NOTE — Progress Notes (Deleted)
Office Visit Note  Patient: Shawna Williams             Date of Birth: Apr 21, 1956           MRN: [id number]             PCP: Kirstie Peri, MD Referring: Kirstie Peri, MD Visit Date: 11/03/2017 Occupation: @GUAROCC @    Subjective:  No chief complaint on file.   History of Present Illness: Shawna Williams is a 62 y.o. female ***   Activities of Daily Living:  Patient reports morning stiffness for *** {minute/hour:19697}.   Patient {ACTIONS;DENIES/REPORTS:21021675::"Denies"} nocturnal pain.  Difficulty dressing/grooming: {ACTIONS;DENIES/REPORTS:21021675::"Denies"} Difficulty climbing stairs: {ACTIONS;DENIES/REPORTS:21021675::"Denies"} Difficulty getting out of chair: {ACTIONS;DENIES/REPORTS:21021675::"Denies"} Difficulty using hands for taps, buttons, cutlery, and/or writing: {ACTIONS;DENIES/REPORTS:21021675::"Denies"}   No Rheumatology ROS completed.   PMFS History:  Patient Active Problem List   Diagnosis Date Noted  . 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 Primary osteoarthritis of both hands 06/28/2016  . Primary osteoarthritis of both feet 06/28/2016  . 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 High risk medication use 06/03/2016  . Myofacial muscle pain 06/03/2016  . Other fatigue 06/03/2016  . Other insomnia 06/03/2016  . Chronic pain syndrome 06/03/2016    Past Medical History:  Diagnosis Date  . Arthritis    rhemuatoid  . Hypertension   . Rheumatoid arteritis 12/24/2016    Family History  Problem Relation Age of Onset  . Stroke Mother   . Diabetes Mother   . Heart attack Father   . Cancer Sister   . Diabetes Brother   . Diabetes Sister   . Heart attack Sister    Past Surgical History:  Procedure Laterality Date  . CESAREAN SECTION  1981 and 1985  . COLONOSCOPY N/A 01/01/2016   Procedure: COLONOSCOPY;  Surgeon: 01/03/2016, MD;  Location: [facility];  Service: Endoscopy;  Laterality: N/A;  930  . FOOT SURGERY Left    with  screws  . TONSILLECTOMY     Social History   Social History Narrative  . Not on file     Objective: Vital Signs: There were no vitals taken for this visit.   Physical Exam   Musculoskeletal Exam: ***  CDAI Exam: No CDAI exam completed.    Investigation: No additional findings.TB Gold: 09/23/2017 Negative  CBC Latest Ref Rng & Units 09/23/2017 05/20/2017 09/03/2016  WBC 3.8 - 10.8 Thousand/uL 6.0 5.6 8.3  Hemoglobin 11.7 - 15.5 g/dL 09/05/2016 73.4 28.7  Hematocrit 35.0 - 45.0 % 37.6 40.1 37.0  Platelets 140 - 400 Thousand/uL 342 340 402(H)   CMP Latest Ref Rng & Units 09/23/2017 05/20/2017 09/03/2016  Glucose 65 - 99 mg/dL 09/05/2016) 157(W 620)  BUN 7 - 25 mg/dL 21 19 355(H)  Creatinine 0.50 - 0.99 mg/dL 74(B) 6.38(G 5.36  Sodium 135 - 146 mmol/L 139 140 141  Potassium 3.5 - 5.3 mmol/L 4.0 4.1 3.9  Chloride 98 - 110 mmol/L 105 106 99  CO2 20 - 32 mmol/L 24 25 22   Calcium 8.6 - 10.4 mg/dL 9.6 9.3 9.4  Total Protein 6.1 - 8.1 g/dL 6.8 7.4 7.4  Total Bilirubin 0.2 - 1.2 mg/dL 0.6 0.5 0.4  Alkaline Phos 39 - 117 IU/L - - 88  AST 10 - 35 U/L 19 18 23   ALT 6 - 29 U/L 22 18 27     Imaging: No results found.  Speciality Comments: No specialty comments available.    Procedures:  No procedures performed Allergies: Diltiazem; Ketorolac;  and Codeine   Assessment / Plan:     Visit Diagnoses: No diagnosis found.    Orders: No orders of the defined types were placed in this encounter.  No orders of the defined types were placed in this encounter.   Face-to-face time spent with patient was *** minutes. 50% of time was spent in counseling and coordination of care.  Follow-Up Instructions: No follow-ups on file.   Ellen Henri, CMA  Note - This record has been created using Animal nutritionist.  Chart creation errors have been sought, but may not always  have been located. Such creation errors do not reflect on  the standard of medical care.

## 2017-10-20 NOTE — Telephone Encounter (Signed)
Last Visit: 05/31/17 Next Visit: 11/03/17  Okay to refill Trazodone?

## 2017-10-31 NOTE — Progress Notes (Signed)
Office Visit Note  Patient: Shawna Williams             Date of Birth: 1955-09-16           MRN: [id number]             PCP: Kirstie Peri, MD Referring: Kirstie Peri, MD Visit Date: 11/14/2017 Occupation: @GUAROCC @    Subjective:  Bilateral feet pain    History of Present Illness: Shawna Williams is a 62 y.o. female with history of seropositive rheumatoid arthritis and osteoarthritis.  Patient states that at her last visit she was doing well, so the plan was to decrease her dose of MTX from 0.8 to 0.4 and to space Enbrel dosing to every other week.  For the past 1 month, she reports she has been having increased pain and swelling bilateral feet and ankle joints.  She states she is also having stiffness in her bilateral feet.  She has been icing her feet and elevating her feet for pain relief.  She is also been taking tramadol as needed for pain relief.  She states that she is also having discomfort in her left CMC joint.  She denies any swelling in her hands at this time.  She states she continues to have generalized muscle tenderness and muscle aches.  Activities of Daily Living:  Patient reports morning stiffness for  all day.   Patient Denies nocturnal pain.  Difficulty dressing/grooming: Denies Difficulty climbing stairs: Reports Difficulty getting out of chair: Reports Difficulty using hands for taps, buttons, cutlery, and/or writing: Denies   Review of Systems  Constitutional: Positive for fatigue.  HENT: Positive for mouth sores. Negative for mouth dryness and nose dryness.   Eyes: Negative for pain, visual disturbance and dryness.  Respiratory: Positive for cough (Hx of chronic cough ). Negative for hemoptysis, shortness of breath and difficulty breathing.   Cardiovascular: Negative for chest pain, palpitations, hypertension and swelling in legs/feet.  Gastrointestinal: Negative for blood in stool, constipation and diarrhea.  Endocrine: Negative for increased urination.    Genitourinary: Negative for painful urination.  Musculoskeletal: Positive for arthralgias, joint pain, joint swelling, morning stiffness and muscle tenderness. Negative for myalgias, muscle weakness and myalgias.  Skin: Negative for color change, pallor, rash, hair loss, nodules/bumps, skin tightness, ulcers and sensitivity to sunlight.  Allergic/Immunologic: Negative for susceptible to infections.  Neurological: Negative for dizziness, numbness, headaches and weakness.  Hematological: Negative for swollen glands.  Psychiatric/Behavioral: Positive for depressed mood (On Effexor). Negative for sleep disturbance. The patient is not nervous/anxious.     PMFS History:  Patient Active Problem List   Diagnosis Date Noted  . 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 Primary osteoarthritis of both hands 06/28/2016  . Primary osteoarthritis of both feet 06/28/2016  . 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 High risk medication use 06/03/2016  . Myofascial muscle pain 06/03/2016  . Other fatigue 06/03/2016  . Other insomnia 06/03/2016  . Chronic pain syndrome 06/03/2016    Past Medical History:  Diagnosis Date  . Arthritis    rhemuatoid  . Hypertension   . Rheumatoid arteritis 12/24/2016    Family History  Problem Relation Age of Onset  . Stroke Mother   . Diabetes Mother   . Heart attack Father   . Cancer Sister   . Diabetes Brother   . Diabetes Sister   . Heart attack Sister    Past Surgical History:  Procedure Laterality Date  . CESAREAN SECTION  1981 and 1985  .  COLONOSCOPY N/A 01/01/2016   Procedure: COLONOSCOPY;  Surgeon: Malissa Hippo, MD;  Location: [facility];  Service: Endoscopy;  Laterality: N/A;  930  . FOOT SURGERY Left    with screws  . TONSILLECTOMY     Social History   Social History Narrative  . Not on file     Objective: Vital Signs: BP 139/86 (BP Location: Left Arm, Patient Position: Sitting, Cuff Size: Small)   Pulse 90    Resp 14   Ht 5\' 6"  (1.676 m)   Wt 211 lb (95.7 kg)   BMI 34.06 kg/m    Physical Exam  Constitutional: She is oriented to person, place, and time. She appears well-developed and well-nourished.  HENT:  Head: Normocephalic and atraumatic.  Eyes: Conjunctivae and EOM are normal.  Neck: Normal range of motion.  Cardiovascular: Normal rate, regular rhythm, normal heart sounds and intact distal pulses.  Pulmonary/Chest: Effort normal and breath sounds normal.  Abdominal: Soft. Bowel sounds are normal.  Lymphadenopathy:    She has no cervical adenopathy.  Neurological: She is alert and oriented to person, place, and time.  Skin: Skin is warm and dry. Capillary refill takes less than 2 seconds.  Psychiatric: She has a normal mood and affect. Her behavior is normal.  Nursing note and vitals reviewed.    Musculoskeletal Exam: C-spine, thoracic spine, lumbar spine good range of motion.  No midline spinal tenderness.  No SI joint tenderness.  Shoulder joints, elbow joints, wrist joints, MCPs, PIPs, DIPs good range of motion no synovitis.  She has left CMC joint tenderness.  Hip joints, knee joints, ankle joints, MTPs, PIPs, DIPs good range of motion.  No warmth or effusion of bilateral knees.  She has tenderness of bilateral ankles.  She has tenderness of all MTP joints.  CDAI Exam: CDAI Homunculus Exam:   Tenderness:  RLE: tibiotalar LLE: tibiotalar Right foot: 1st MTP, 2nd MTP, 3rd MTP, 4th MTP and 5th MTP Left foot: 1st MTP, 2nd MTP, 3rd MTP, 4th MTP and 5th MTP  Swelling:  Right foot: 1st MTP, 2nd MTP, 3rd MTP, 4th MTP and 5th MTP Left foot: 1st MTP, 2nd MTP, 3rd MTP, 4th MTP and 5th MTP  Joint Counts:  CDAI Tender Joint count: 0 CDAI Swollen Joint count: 0  Global Assessments:  Patient Global Assessment: 8   CDAI Calculated Score: 8    Investigation: No additional findings. CBC Latest Ref Rng & Units 09/23/2017 05/20/2017 09/03/2016  WBC 3.8 - 10.8 Thousand/uL 6.0 5.6 8.3   Hemoglobin 11.7 - 15.5 g/dL 16.1 09.6 04.5  Hematocrit 35.0 - 45.0 % 37.6 40.1 37.0  Platelets 140 - 400 Thousand/uL 342 340 402(H)   CMP Latest Ref Rng & Units 09/23/2017 05/20/2017 09/03/2016  Glucose 65 - 99 mg/dL 409(W) 119 147(W)  BUN 7 - 25 mg/dL 21 19 29(F)  Creatinine 0.50 - 0.99 mg/dL 6.21(H) 0.86 5.78  Sodium 135 - 146 mmol/L 139 140 141  Potassium 3.5 - 5.3 mmol/L 4.0 4.1 3.9  Chloride 98 - 110 mmol/L 105 106 99  CO2 20 - 32 mmol/L 24 25 22   Calcium 8.6 - 10.4 mg/dL 9.6 9.3 9.4  Total Protein 6.1 - 8.1 g/dL 6.8 7.4 7.4  Total Bilirubin 0.2 - 1.2 mg/dL 0.6 0.5 0.4  Alkaline Phos 39 - 117 IU/L - - 88  AST 10 - 35 U/L 19 18 23   ALT 6 - 29 U/L 22 18 27     Imaging: No results found.  Speciality Comments:  No specialty comments available.    Procedures:  No procedures performed Allergies: Diltiazem; Ketorolac; and Codeine   Assessment / Plan:     Visit Diagnoses: Seropositive rheumatoid arthritis of multiple sites [facility]): She has tenderness and synovitis of all MTP joints.  She has tenderness of bilateral ankle joints.  At her last visit in November she was doing well and decided to space out her Enbrel to every other week and to inject methotrexate 0.4 mL weekly.  Due to her increased joint pain or joint swelling we will return to her original treatment plan and she will inject Enbrel every week subcutaneously and inject methotrexate 0.8 mL weekly.  A refill of methotrexate and folic acid was sent to the pharmacy today.  High risk medication use - Enbrel 50 mg sq q week, MTX 0.4 ml sq q wk , folic acid 1 mg po qd TB gold: 3/8/19CBC and CMP: 09/23/17.  She will return in June and every 3 months to monitor for drug toxicity.  Primary osteoarthritis of both hands: She has PIP and DIP synovial thickening consistent with zoster arthritis.  Joint protection muscle strengthening were discussed.  Primary osteoarthritis of both feet: She has PIP and DIP synovial thickening consistent  osteoarthritis of bilateral feet.  She is been having increased discomfort in bilateral feet as well.  She has synovitis and tenderness of MTP joints.  We discussed the importance of wearing proper fitting shoes.  Chronic pain syndrome: She takes tramadol 50 mg as needed.  Refill tramadol as prescribed.  Narcotic agreement and UDS are up-to-date.  Myofascial muscle pain: She has generalized muscle tenderness and muscle tension.  Other fatigue: Chronic  Other insomnia: Chronic.  She takes trazodone 50 mg at bedtime.   Orders: No orders of the defined types were placed in this encounter.  Meds ordered this encounter  Medications  . traMADol (ULTRAM) 50 MG tablet    Sig: Take 1 tablet (50 mg total) by mouth at bedtime.    Dispense:  30 tablet    Refill:  0    This prescription was filled on 08/30/2017. Any refills authorized will be placed on file.  . methotrexate 50 MG/2ML injection    Sig: INJECT 0.8 MLS INTO THE SKIN ONCE A WEEK    Dispense:  4 mL    Refill:  2  . folic acid (FOLVITE) 1 MG tablet    Sig: Take 2 tablets (2 mg total) by mouth daily.    Dispense:  180 tablet    Refill:  3    This prescription was filled on 06/01/2017. Any refills authorized will be placed on file.    Face-to-face time spent with patient was 30 minutes. >50% of time was spent in counseling and coordination of care.  Follow-Up Instructions: Return in about 5 months (around 04/16/2018) for Rheumatoid arthritis, Osteoarthritis.   Gearldine Bienenstock, PA-C  Note - This record has been created using Dragon software.  Chart creation errors have been sought, but may not always  have been located. Such creation errors do not reflect on  the standard of medical care.

## 2017-11-03 ENCOUNTER — Ambulatory Visit: Payer: BC Managed Care – PPO | Admitting: Rheumatology

## 2017-11-14 ENCOUNTER — Encounter: Payer: Self-pay | Admitting: Physician Assistant

## 2017-11-14 ENCOUNTER — Ambulatory Visit: Payer: BC Managed Care – PPO | Admitting: Physician Assistant

## 2017-11-14 VITALS — BP 139/86 | HR 90 | Resp 14 | Ht 66.0 in | Wt 211.0 lb

## 2017-11-14 DIAGNOSIS — G894 Chronic pain syndrome: Secondary | ICD-10-CM

## 2017-11-14 DIAGNOSIS — M19041 Primary osteoarthritis, right hand: Secondary | ICD-10-CM | POA: Diagnosis not present

## 2017-11-14 DIAGNOSIS — M19071 Primary osteoarthritis, right ankle and foot: Secondary | ICD-10-CM | POA: Diagnosis not present

## 2017-11-14 DIAGNOSIS — M0579 Rheumatoid arthritis with rheumatoid factor of multiple sites without organ or systems involvement: Secondary | ICD-10-CM | POA: Diagnosis not present

## 2017-11-14 DIAGNOSIS — M19072 Primary osteoarthritis, left ankle and foot: Secondary | ICD-10-CM | POA: Diagnosis not present

## 2017-11-14 DIAGNOSIS — M19042 Primary osteoarthritis, left hand: Secondary | ICD-10-CM | POA: Diagnosis not present

## 2017-11-14 DIAGNOSIS — R5383 Other fatigue: Secondary | ICD-10-CM

## 2017-11-14 DIAGNOSIS — M7918 Myalgia, other site: Secondary | ICD-10-CM

## 2017-11-14 DIAGNOSIS — Z79899 Other long term (current) drug therapy: Secondary | ICD-10-CM

## 2017-11-14 DIAGNOSIS — G4709 Other insomnia: Secondary | ICD-10-CM | POA: Diagnosis not present

## 2017-11-14 MED ORDER — TRAMADOL HCL 50 MG PO TABS: 50.0000 mg | ORAL_TABLET | Freq: Every day | ORAL | 0 refills | Status: DC

## 2017-11-14 MED ORDER — FOLIC ACID 1 MG PO TABS: 2.0000 mg | ORAL_TABLET | Freq: Every day | ORAL | 3 refills | Status: DC

## 2017-11-14 MED ORDER — METHOTREXATE SODIUM CHEMO INJECTION 50 MG/2ML: INTRAMUSCULAR | 2 refills | Status: DC

## 2017-11-14 NOTE — Patient Instructions (Signed)
Standing Labs We placed an order today for your standing lab work.    Please come back and get your standing labs in June and every 3 months  We have open lab Monday through Friday from 8:30-11:30 AM and 1:30-4:00 PM  at the office of Dr. Shaili Deveshwar.   You may experience shorter wait times on Monday and Friday afternoons. The office is located at [address], [address], [place], [place] [address] No appointment is necessary.   Labs are drawn by [facility].  You may receive a bill from [facility] for your lab work. If you have any questions regarding directions or hours of operation,  please call [phone].    

## 2017-11-17 ENCOUNTER — Other Ambulatory Visit (HOSPITAL_COMMUNITY): Payer: Self-pay | Admitting: Respiratory Therapy

## 2017-11-17 DIAGNOSIS — R05 Cough: Secondary | ICD-10-CM

## 2017-11-17 DIAGNOSIS — R059 Cough, unspecified: Secondary | ICD-10-CM

## 2017-11-22 ENCOUNTER — Ambulatory Visit (HOSPITAL_COMMUNITY)
Admission: RE | Admit: 2017-11-22 | Discharge: 2017-11-22 | Disposition: A | Discharge status: Home / Self Care | Payer: BC Managed Care – PPO | Source: Ambulatory Visit | Attending: Pulmonary Disease | Admitting: Pulmonary Disease

## 2017-11-22 DIAGNOSIS — J45909 Unspecified asthma, uncomplicated: Secondary | ICD-10-CM | POA: Diagnosis not present

## 2017-11-22 DIAGNOSIS — R05 Cough: Secondary | ICD-10-CM | POA: Insufficient documentation

## 2017-11-22 DIAGNOSIS — R059 Cough, unspecified: Secondary | ICD-10-CM

## 2017-11-22 LAB — PULMONARY FUNCTION TEST
FEF 25-75 Post: 2.56 L/sec
FEF 25-75 Pre: 2.74 L/sec
FEF2575-%Change-Post: -6 %
FEF2575-%Pred-Post: 107 %
FEF2575-%Pred-Pre: 114 %
FEV1-%Change-Post: -1 %
FEV1-%Pred-Post: 94 %
FEV1-%Pred-Pre: 95 %
FEV1-Post: 2.55 L
FEV1-Pre: 2.58 L
FEV1FVC-%Change-Post: 0 %
FEV1FVC-%Pred-Pre: 103 %
FEV6-%Change-Post: 0 %
FEV6-%Pred-Post: 93 %
FEV6-%Pred-Pre: 94 %
FEV6-Post: 3.17 L
FEV6-Pre: 3.19 L
FEV6FVC-%Pred-Post: 104 %
FEV6FVC-%Pred-Pre: 104 %
FVC-%Change-Post: 0 %
FVC-%Pred-Post: 90 %
FVC-%Pred-Pre: 90 %
FVC-Post: 3.17 L
FVC-Pre: 3.19 L
Post FEV1/FVC ratio: 81 %
Post FEV6/FVC ratio: 100 %
Pre FEV1/FVC ratio: 81 %
Pre FEV6/FVC Ratio: 100 %

## 2017-11-22 MED ORDER — METHACHOLINE 0.25 MG/ML NEB SOLN
2.0000 mL | Freq: Once | RESPIRATORY_TRACT | Status: AC
  Administered 2017-11-22: 0.5 mg via RESPIRATORY_TRACT

## 2017-11-22 MED ORDER — SODIUM CHLORIDE 0.9 % IN NEBU
3.0000 mL | INHALATION_SOLUTION | Freq: Once | RESPIRATORY_TRACT | Status: AC
  Administered 2017-11-22: 3 mL via RESPIRATORY_TRACT

## 2017-11-22 MED ORDER — METHACHOLINE 1 MG/ML NEB SOLN
2.0000 mL | Freq: Once | RESPIRATORY_TRACT | Status: AC
  Administered 2017-11-22: 2 mg via RESPIRATORY_TRACT

## 2017-11-22 MED ORDER — ALBUTEROL SULFATE (2.5 MG/3ML) 0.083% IN NEBU
2.5000 mg | INHALATION_SOLUTION | Freq: Once | RESPIRATORY_TRACT | Status: AC
  Administered 2017-11-22: 2.5 mg via RESPIRATORY_TRACT

## 2017-11-22 MED ORDER — METHACHOLINE 4 MG/ML NEB SOLN
2.0000 mL | Freq: Once | RESPIRATORY_TRACT | Status: AC
  Administered 2017-11-22: 8 mg via RESPIRATORY_TRACT

## 2017-11-22 MED ORDER — METHACHOLINE 16 MG/ML NEB SOLN: 2.0000 mL | Freq: Once | RESPIRATORY_TRACT | Status: DC

## 2017-11-22 MED ORDER — METHACHOLINE 0.0625 MG/ML NEB SOLN
2.0000 mL | Freq: Once | RESPIRATORY_TRACT | Status: AC
  Administered 2017-11-22: 0.125 mg via RESPIRATORY_TRACT

## 2017-11-24 ENCOUNTER — Other Ambulatory Visit: Payer: Self-pay | Admitting: Rheumatology

## 2017-11-24 NOTE — Telephone Encounter (Signed)
Last visit: 11/14/17 Next Visit: 04/21/18  Okay to refill Trazodone?

## 2017-12-21 ENCOUNTER — Encounter: Payer: Self-pay | Admitting: *Deleted

## 2017-12-22 ENCOUNTER — Ambulatory Visit: Payer: BC Managed Care – PPO | Admitting: Cardiovascular Disease

## 2017-12-22 ENCOUNTER — Telehealth: Payer: Self-pay | Admitting: Cardiovascular Disease

## 2017-12-22 ENCOUNTER — Encounter: Payer: Self-pay | Admitting: *Deleted

## 2017-12-22 ENCOUNTER — Encounter: Payer: Self-pay | Admitting: Cardiovascular Disease

## 2017-12-22 VITALS — BP 110/82 | HR 90 | Ht 66.0 in | Wt 205.0 lb

## 2017-12-22 DIAGNOSIS — R079 Chest pain, unspecified: Secondary | ICD-10-CM

## 2017-12-22 DIAGNOSIS — M069 Rheumatoid arthritis, unspecified: Secondary | ICD-10-CM

## 2017-12-22 DIAGNOSIS — I10 Essential (primary) hypertension: Secondary | ICD-10-CM | POA: Diagnosis not present

## 2017-12-22 DIAGNOSIS — I2510 Atherosclerotic heart disease of native coronary artery without angina pectoris: Secondary | ICD-10-CM | POA: Diagnosis not present

## 2017-12-22 NOTE — Patient Instructions (Signed)
Medication Instructions:  Continue all current medications.  Labwork: none  Testing/Procedures: Your physician has requested that you have en exercise stress myoview. For further information please visit [email or web address]. Please follow instruction sheet, as given.  Office will contact with results via phone or letter.    Follow-Up: 2 months   Any Other Special Instructions Will Be Listed Below (If Applicable).  If you need a refill on your cardiac medications before your next appointment, please call your pharmacy.  

## 2017-12-22 NOTE — Telephone Encounter (Signed)
Pre-cert Verification for the following procedure   Cardiolite Stress scheduled for 01/03/18 at [facility]

## 2017-12-22 NOTE — Progress Notes (Addendum)
CARDIOLOGY CONSULT NOTE  Patient ID: Shawna Williams MRN: [id number] DOB/AGE: [id number] y.o.  Admit date: (Not on file) Primary Physician: Kirstie Peri, MD Referring Physician: Dr. Juanetta Gosling  Reason for Consultation: Coronary artery calcifications  HPI: Shawna Williams is a 62 y.o. female who is being seen today for the evaluation of coronary artery calcifications at the request of Kari Baars, MD.   Past medical history includes rheumatoid arthritis and hypertension.  I reviewed a chest CT performed at [facility] dated 10/14/2017 which demonstrated coronary artery calcifications and cholelithiasis.  There was no evidence of interstitial lung disease.  She had pulmonary function testing which was unremarkable but still may have reactive airway disease according to pulmonary.  I personally reviewed the ECG performed today which demonstrates sinus rhythm with anteroseptal Q waves.  She has had chest pains for years which typically occur when she "has a lot going on".  Her PCP said it may be due to anxiety.  She was told by her rheumatologist that it may be due to arthritic inflammation.  She denies exertional chest pain and exertional dyspnea.  She has had a persistent cough for the past 2 years.  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  She has been tried on antibiotics and prednisone for her cough and it subsides.  She has never smoked.  She denies any known stressors at home.  She has bilateral feet pain primarily when walking and attributes this to rheumatoid arthritis.  She has occasional bilateral hand numbness.  Social history: Her husband, [name], is also my patient.  Family history: Her father had an MI at age 87 and CABG at age  80 and died at the age of 64.  He was a smoker.  He also had interstitial lung disease and was a Education officer, environmental.  Most of his brothers and sisters were dead by the age of 59.  Her mother had a stroke.  Her middle sister had an MI at age 72 and was a smoker.  She died at 60 of an MI.  Her older sister died at the age of 25 of cancer.  She was a smoker.    Allergies  Allergen Reactions  . Diltiazem     Other reaction(s): fatigue  . Ketorolac     ? EDEMA, SOB  . Codeine Nausea Only    Current Outpatient Medications  Medication Sig Dispense Refill  . calcium carbonate 1250 MG capsule Take by mouth daily.    . cholecalciferol (VITAMIN D) 1000 units tablet Take 5,000 Units by mouth. Saturday and Sunday    . Cinnamon 500 MG capsule Take 1,000 mg by mouth daily.    . diclofenac sodium (VOLTAREN) 1 % GEL Voltaren Gel 3 grams to 3 large joints upto TID 3 TUBES with 3 refills 3 Tube 3  . ENBREL SURECLICK 50 MG/ML injection INJECT ONE SURECLICK PEN (50 MG) SUBCUTANEOUSLY ONCE A WEEK. REFRIGERATE. DO NOT FREEZE. 4 Syringe 0  . etanercept (ENBREL) 50 MG/ML injection Inject 50 mg into the skin once a week.    . folic acid (FOLVITE) 1 MG tablet Take 2 tablets (2 mg total) by mouth daily. 180 tablet  3  . losartan-hydrochlorothiazide (HYZAAR) 100-25 MG tablet daily.    . methotrexate 50 MG/2ML injection INJECT 0.8 MLS INTO THE SKIN ONCE A WEEK 4 mL 2  . Multiple Vitamin (MULTIVITAMIN) tablet Take 1 tablet by mouth daily.    . Multiple Vitamins-Minerals (ICAPS AREDS 2 PO) Take by mouth daily.    Marland Kitchen omeprazole (PRILOSEC) 40 MG capsule Take 40 mg by mouth daily.    Marland Kitchen topiramate (TOPAMAX) 50 MG tablet Take 50 mg by mouth. Every other day    . traMADol (ULTRAM) 50 MG tablet Take 1 tablet (50 mg total) by mouth at bedtime. (Patient taking differently: Take 50 mg by mouth every 12 (twelve) hours as needed. ) 30 tablet 0  . traZODone (DESYREL) 50 MG tablet TAKE 1 TABLET BY MOUTH AT BEDTIME (Patient taking  differently: prn) 30 tablet 0  . valsartan (DIOVAN) 160 MG tablet Take 160 mg by mouth daily.    . valsartan-hydrochlorothiazide (DIOVAN-HCT) 160-12.5 MG tablet     . venlafaxine (EFFEXOR) 75 MG tablet Take 75 mg by mouth once.     No current facility-administered medications for this visit.     Past Medical History:  Diagnosis Date  . Arthritis    rhemuatoid  . Hyperlipemia   . Hypertension   . Major depressive disorder   . 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 Type 2 diabetes mellitus (HCC)     Past Surgical History:  Procedure Laterality Date  . CESAREAN SECTION  1981 and 1985  . COLONOSCOPY N/A 01/01/2016   Procedure: COLONOSCOPY;  Surgeon: Malissa Hippo, MD;  Location: [facility];  Service: Endoscopy;  Laterality: N/A;  930  . FOOT SURGERY Left    with screws  . TONSILLECTOMY      Social History   Socioeconomic History  . Marital status: Married    Spouse name: Not on file  . Number of children: Not on file  . Years of education: Not on file  . Highest education level: Not on file  Occupational History  . Not on file  Social Needs  . Financial resource strain: Not on file  . Food insecurity:    Worry: Not on file    Inability: Not on file  . Transportation needs:    Medical: Not on file    Non-medical: Not on file  Tobacco Use  . Smoking status: Never Smoker  . Smokeless tobacco: Never Used  Substance and Sexual Activity  . Alcohol use: Yes    Comment: rarely  . Drug use: No  . Sexual activity: Not on file  Lifestyle  . Physical activity:    Days per week: Not on file    Minutes per session: Not on file  . Stress: Not on file  Relationships  . Social connections:    Talks on phone: Not on file    Gets together: Not on file    Attends religious service: Not on file    Active member of club or organization: Not on file    Attends meetings of clubs or organizations: Not on file    Relationship status: Not on file  . Intimate partner violence:     Fear of current or ex partner: Not on file    Emotionally abused: Not on file    Physically abused: Not on file    Forced sexual activity: Not on file  Other Topics Concern  . Not on file  Social History Narrative  . Not on file  Current Meds  Medication Sig  . calcium carbonate 1250 MG capsule Take by mouth daily.  . cholecalciferol (VITAMIN D) 1000 units tablet Take 5,000 Units by mouth. Saturday and Sunday  . Cinnamon 500 MG capsule Take 1,000 mg by mouth daily.  . diclofenac sodium (VOLTAREN) 1 % GEL Voltaren Gel 3 grams to 3 large joints upto TID 3 TUBES with 3 refills  . ENBREL SURECLICK 50 MG/ML injection INJECT ONE SURECLICK PEN (50 MG) SUBCUTANEOUSLY ONCE A WEEK. REFRIGERATE. DO NOT FREEZE.  Marland Kitchen etanercept (ENBREL) 50 MG/ML injection Inject 50 mg into the skin once a week.  . folic acid (FOLVITE) 1 MG tablet Take 2 tablets (2 mg total) by mouth daily.  Marland Kitchen losartan-hydrochlorothiazide (HYZAAR) 100-25 MG tablet daily.  . methotrexate 50 MG/2ML injection INJECT 0.8 MLS INTO THE SKIN ONCE A WEEK  . Multiple Vitamin (MULTIVITAMIN) tablet Take 1 tablet by mouth daily.  . Multiple Vitamins-Minerals (ICAPS AREDS 2 PO) Take by mouth daily.  Marland Kitchen omeprazole (PRILOSEC) 40 MG capsule Take 40 mg by mouth daily.  Marland Kitchen topiramate (TOPAMAX) 50 MG tablet Take 50 mg by mouth. Every other day  . traMADol (ULTRAM) 50 MG tablet Take 1 tablet (50 mg total) by mouth at bedtime. (Patient taking differently: Take 50 mg by mouth every 12 (twelve) hours as needed. )  . traZODone (DESYREL) 50 MG tablet TAKE 1 TABLET BY MOUTH AT BEDTIME (Patient taking differently: prn)  . valsartan (DIOVAN) 160 MG tablet Take 160 mg by mouth daily.  . valsartan-hydrochlorothiazide (DIOVAN-HCT) 160-12.5 MG tablet   . venlafaxine (EFFEXOR) 75 MG tablet Take 75 mg by mouth once.      Review of systems complete and found to be negative unless listed above in HPI    Physical exam Blood pressure 110/82, pulse 90, height 5'  6" (1.676 m), weight 205 lb (93 kg), SpO2 98 %. General: NAD Neck: No JVD, no thyromegaly or thyroid nodule.  Lungs: Clear to auscultation bilaterally with normal respiratory effort. CV: Nondisplaced PMI. Regular rate and rhythm, normal S1/S2, no S3/S4, no murmur.  No peripheral edema.  No carotid bruit.   Abdomen: Soft, nontender, no distention.  Skin: Intact without lesions or rashes.  Neurologic: Alert and oriented x 3.  Psych: Normal affect. Extremities: No clubbing or cyanosis.  HEENT: Normal.   ECG: Most recent ECG reviewed.   Labs: Lab Results  Component Value Date/Time   K 4.0 09/23/2017 11:05 AM   BUN 21 09/23/2017 11:05 AM   BUN 28 (H) 09/03/2016 12:19 PM   CREATININE 1.04 (H) 09/23/2017 11:05 AM   ALT 22 09/23/2017 11:05 AM   HGB 12.8 09/23/2017 11:05 AM   HGB 12.2 09/03/2016 12:19 PM     Lipids: No results found for: LDLCALC, LDLDIRECT, CHOL, TRIG, HDL      ASSESSMENT AND PLAN:  1.  Coronary artery calcifications and chest pain: This may simply indicate atherosclerosis of the coronary arteries.  In order to evaluate for hemodynamically significant arterial disease, I will obtain an exercise Myoview stress test.  Her chest pains are atypical in that they typically occur at rest.  Cardiovascular risk factors include hypertension and rheumatoid arthritis.  She also has a strong family history of premature coronary artery disease although she is a never smoker and her siblings and father were smokers.  2.  Hypertension: Controlled on present therapy which includes losartan and hydrochlorothiazide.  No changes.  3.  Rheumatoid arthritis: Currently on Enbrel and methotrexate.   Disposition: Follow  up in 2 months  Signed: Prentice Docker, M.D., F.A.C.C.  12/22/2017, 9:22 AM

## 2017-12-22 NOTE — Addendum Note (Signed)
Addended by: Lesle Chris on: 12/22/2017 09:51 AM   Modules accepted: Orders

## 2018-01-03 ENCOUNTER — Encounter (HOSPITAL_BASED_OUTPATIENT_CLINIC_OR_DEPARTMENT_OTHER)
Admission: RE | Admit: 2018-01-03 | Discharge: 2018-01-03 | Disposition: A | Discharge status: Home / Self Care | Payer: BC Managed Care – PPO | Source: Ambulatory Visit | Attending: Cardiovascular Disease | Admitting: Cardiovascular Disease

## 2018-01-03 ENCOUNTER — Encounter (HOSPITAL_COMMUNITY): Payer: Self-pay

## 2018-01-03 ENCOUNTER — Encounter (HOSPITAL_COMMUNITY)
Admission: RE | Admit: 2018-01-03 | Discharge: 2018-01-03 | Disposition: A | Discharge status: Home / Self Care | Payer: BC Managed Care – PPO | Source: Ambulatory Visit | Attending: Cardiovascular Disease | Admitting: Cardiovascular Disease

## 2018-01-03 DIAGNOSIS — R079 Chest pain, unspecified: Secondary | ICD-10-CM

## 2018-01-03 LAB — NM MYOCAR MULTI W/SPECT W/WALL MOTION / EF
Estimated workload: 7 METS
Exercise duration (min): 4 min
Exercise duration (sec): 32 s
LV dias vol: 67 mL (ref 46–106)
LV sys vol: 22 mL
MPHR: 159 {beats}/min
Peak HR: 151 {beats}/min
Percent HR: 94 %
RATE: 0.45
RPE: 16
Rest HR: 80 {beats}/min
SDS: 3
SRS: 1
SSS: 4
TID: 1.31

## 2018-01-03 MED ORDER — TECHNETIUM TC 99M TETROFOSMIN IV KIT
10.0000 | PACK | Freq: Once | INTRAVENOUS | Status: AC | PRN
  Administered 2018-01-03: 10 via INTRAVENOUS

## 2018-01-03 MED ORDER — SODIUM CHLORIDE 0.9% FLUSH
INTRAVENOUS | Status: AC
  Administered 2018-01-03: 10 mL via INTRAVENOUS
  Filled 2018-01-03: qty 10

## 2018-01-03 MED ORDER — REGADENOSON 0.4 MG/5ML IV SOLN
INTRAVENOUS | Status: AC
  Filled 2018-01-03: qty 5

## 2018-01-03 MED ORDER — TECHNETIUM TC 99M TETROFOSMIN IV KIT
30.0000 | PACK | Freq: Once | INTRAVENOUS | Status: AC | PRN
  Administered 2018-01-03: 31 via INTRAVENOUS

## 2018-01-04 ENCOUNTER — Telehealth: Payer: Self-pay | Admitting: *Deleted

## 2018-01-04 NOTE — Telephone Encounter (Signed)
Notes recorded by Lesle Chris, LPN on 03/16/5620 at 9:43 AM EDT Patient notified. Copy to pmd. Follow up scheduled for August with Dr. Purvis Sheffield.   ------  Notes recorded by Laqueta Linden, MD on 01/04/2018 at 8:59 AM EDT Low risk for blockages.

## 2018-01-17 ENCOUNTER — Telehealth: Payer: Self-pay | Admitting: Rheumatology

## 2018-01-17 MED ORDER — TRAMADOL HCL 50 MG PO TABS: 50.0000 mg | ORAL_TABLET | Freq: Every day | ORAL | 0 refills | Status: AC

## 2018-01-17 NOTE — Telephone Encounter (Signed)
Patient called requesting to speak with you directly.  Patient did not leave a reason for the call.

## 2018-01-17 NOTE — Telephone Encounter (Signed)
Ok to refill.  Please let me know when she updates narcotic agreement and UDS.

## 2018-01-17 NOTE — Telephone Encounter (Signed)
Patient states she is going out of town to [place] and on to a cruise.   Last Visit: 11/14/17 Next Visit: 04/21/18 UDS: 05/31/17 Narc Agreement: 05/31/17  Patient coming to office 01/18/18 to update UDS and narc agreement.  Okay to refill Tramadol?

## 2018-01-18 ENCOUNTER — Other Ambulatory Visit: Payer: Self-pay | Admitting: *Deleted

## 2018-01-18 ENCOUNTER — Other Ambulatory Visit: Payer: Self-pay

## 2018-01-18 DIAGNOSIS — Z79899 Other long term (current) drug therapy: Secondary | ICD-10-CM

## 2018-01-18 DIAGNOSIS — Z5181 Encounter for therapeutic drug level monitoring: Secondary | ICD-10-CM

## 2018-01-18 LAB — COMPLETE METABOLIC PANEL WITH GFR
AG Ratio: 1.3 (calc) (ref 1.0–2.5)
ALT: 14 U/L (ref 6–29)
AST: 15 U/L (ref 10–35)
Albumin: 4 g/dL (ref 3.6–5.1)
Alkaline phosphatase (APISO): 80 U/L (ref 33–130)
BUN: 23 mg/dL (ref 7–25)
CO2: 24 mmol/L (ref 20–32)
Calcium: 9.3 mg/dL (ref 8.6–10.4)
Chloride: 105 mmol/L (ref 98–110)
Creat: 0.91 mg/dL (ref 0.50–0.99)
GFR, Est African American: 79 mL/min/{1.73_m2} (ref >=60)
GFR, Est Non African American: 68 mL/min/{1.73_m2} (ref >=60)
Globulin: 3 g/dL (calc) (ref 1.9–3.7)
Glucose, Bld: 124 mg/dL — ABNORMAL HIGH (ref 65–99)
Potassium: 4 mmol/L (ref 3.5–5.3)
Sodium: 139 mmol/L (ref 135–146)
Total Bilirubin: 0.3 mg/dL (ref 0.2–1.2)
Total Protein: 7 g/dL (ref 6.1–8.1)

## 2018-01-18 LAB — CBC WITH DIFFERENTIAL/PLATELET
Basophils Absolute: 78 cells/uL (ref 0–200)
Basophils Relative: 1.1 %
Eosinophils Absolute: 618 cells/uL — ABNORMAL HIGH (ref 15–500)
Eosinophils Relative: 8.7 %
HCT: 36.7 % (ref 35.0–45.0)
Hemoglobin: 12.2 g/dL (ref 11.7–15.5)
Lymphs Abs: 2925 cells/uL (ref 850–3900)
MCH: 30.7 pg (ref 27.0–33.0)
MCHC: 33.2 g/dL (ref 32.0–36.0)
MCV: 92.4 fL (ref 80.0–100.0)
MPV: 10.1 fL (ref 7.5–12.5)
Monocytes Relative: 8.8 %
Neutro Abs: 2854 cells/uL (ref 1500–7800)
Neutrophils Relative %: 40.2 %
Platelets: 358 10*3/uL (ref 140–400)
RBC: 3.97 10*6/uL (ref 3.80–5.10)
RDW: 13.6 % (ref 11.0–15.0)
Total Lymphocyte: 41.2 %
WBC mixed population: 625 cells/uL (ref 200–950)
WBC: 7.1 10*3/uL (ref 3.8–10.8)

## 2018-01-18 NOTE — Telephone Encounter (Signed)
Patient came to office for UDS on 01/18/18.

## 2018-01-22 LAB — PAIN MGMT, PROFILE 5 W/CONF, U
Amphetamines: NEGATIVE ng/mL (ref <500)
Barbiturates: NEGATIVE ng/mL (ref <300)
Benzodiazepines: NEGATIVE ng/mL (ref <100)
Cocaine Metabolite: NEGATIVE ng/mL (ref <150)
Creatinine: 44.3 mg/dL
Marijuana Metabolite: NEGATIVE ng/mL (ref <20)
Methadone Metabolite: NEGATIVE ng/mL (ref <100)
Opiates: NEGATIVE ng/mL (ref <100)
Oxidant: NEGATIVE ug/mL (ref <200)
Oxycodone: NEGATIVE ng/mL (ref <100)
pH: 5.98 (ref 4.5–9.0)

## 2018-01-22 LAB — PAIN MGMT, TRAMADOL W/MEDMATCH, U
Desmethyltramadol: NEGATIVE ng/mL (ref <100)
Tramadol: NEGATIVE ng/mL (ref <100)

## 2018-02-13 ENCOUNTER — Telehealth: Payer: Self-pay | Admitting: Pharmacy Technician

## 2018-02-13 NOTE — Telephone Encounter (Signed)
Received a Prior Authorization request from [facility] for Enbrel. Authorization has been submitted to patient's insurance via Fax. Will update once we receive a response.  Phone# [phone] Fax# [phone]  8:55 AM Dorthula Nettles, CPhT

## 2018-02-15 NOTE — Telephone Encounter (Signed)
Received a fax from [facility] regarding a prior authorization for Enbrel. Authorization has been APPROVED from 02/13/18 to 02/14/2020.   Will send document to scan center.  Authorization # [id number] Phone # [phone]  11:11 AM Dorthula Nettles, CPhT

## 2018-02-27 ENCOUNTER — Telehealth: Payer: Self-pay | Admitting: Rheumatology

## 2018-02-27 ENCOUNTER — Other Ambulatory Visit: Payer: Self-pay | Admitting: Rheumatology

## 2018-02-27 NOTE — Telephone Encounter (Signed)
Last visit: 11/14/2017 Next visit: 04/21/2018 Labs: 01/18/2018 Glucose is 124. CBC stable. TB Gold: 09/23/2017 negative   Okay to refill per Dr. Corliss Skains.

## 2018-02-27 NOTE — Telephone Encounter (Signed)
Patient called stating she was approved for "rooster comb injections" last year, but was told that she needed to find a provider to give her the injections.  Patient requested a return call to see if the approval is still good for the injections.

## 2018-02-27 NOTE — Telephone Encounter (Signed)
Called patient and gave her the following information from her chart.   01/21/17 4:45 PM  Note    Per patient's insurance, patient must use another provider for Euflexxa inj. I called patient and gave # [phone] for infornation.     Patient verbalized understanding.

## 2018-03-14 ENCOUNTER — Ambulatory Visit: Payer: BC Managed Care – PPO | Admitting: Cardiovascular Disease

## 2018-03-14 ENCOUNTER — Encounter: Payer: Self-pay | Admitting: Cardiovascular Disease

## 2018-03-14 VITALS — BP 112/84 | HR 108 | Ht 66.0 in | Wt 212.0 lb

## 2018-03-14 DIAGNOSIS — R079 Chest pain, unspecified: Secondary | ICD-10-CM | POA: Diagnosis not present

## 2018-03-14 DIAGNOSIS — M069 Rheumatoid arthritis, unspecified: Secondary | ICD-10-CM | POA: Diagnosis not present

## 2018-03-14 DIAGNOSIS — I2510 Atherosclerotic heart disease of native coronary artery without angina pectoris: Secondary | ICD-10-CM

## 2018-03-14 DIAGNOSIS — M79671 Pain in right foot: Secondary | ICD-10-CM

## 2018-03-14 DIAGNOSIS — I10 Essential (primary) hypertension: Secondary | ICD-10-CM | POA: Diagnosis not present

## 2018-03-14 DIAGNOSIS — M79672 Pain in left foot: Secondary | ICD-10-CM

## 2018-03-14 NOTE — Patient Instructions (Signed)

## 2018-03-14 NOTE — Progress Notes (Signed)
SUBJECTIVE: The patient returns for follow-up after undergoing cardiovascular testing performed for the evaluation of chest pain and coronary artery calcifications.  She underwent a normal nuclear stress test on 01/03/2018, LVEF 68%.  Past medical history includes rheumatoid arthritis and.  She had one episode of chest pain since her last visit with me when things were stressful at home relieved with an anxiolytic.  She denies palpitations, orthopnea, and paroxysmal nocturnal dyspnea.  She has been started on Breo.  She plans to see Dr. Dierdre Forth (rheumatology) in [place] in the near future.  Other complaints relate to bilateral feet pain after standing for long periods and when walking.  She seldom has bilateral feet and ankle swelling.    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 Her husband, [name], is also my patient.  Family history: Her father had an MI at age 39 and CABG at age 7 and died at the age of 18.  He was a smoker.  He also had interstitial lung disease and was a Education officer, environmental.  Most of his brothers and sisters were dead by the age of 49.  Her mother had a stroke.  Her middle sister had an MI at age 41 and was a smoker.  She died at 66 of an MI.  Her older sister died at the age of 24 of cancer.  She was a smoker.  Review of Systems: As per "subjective", otherwise negative.  Allergies  Allergen Reactions  . Diltiazem     Other reaction(s): fatigue  . Ketorolac     ? EDEMA, SOB  . Codeine Nausea Only    Current Outpatient Medications  Medication Sig Dispense Refill  . calcium carbonate 1250 MG capsule Take by mouth daily.    . cholecalciferol (VITAMIN D) 1000 units tablet Take 5,000 Units by mouth. Saturday and Sunday    . Cinnamon 500 MG capsule Take 1,000 mg by mouth daily.    . diclofenac sodium (VOLTAREN) 1 % GEL Voltaren Gel 3 grams to 3 large joints upto TID 3 TUBES with 3 refills 3 Tube 3  .  ENBREL SURECLICK 50 MG/ML injection INJECT ONE SURECLICK PEN SUBCUTANEOUSLY ONCE A WEEK. REFRIGERATE. DO NOT FREEZE. 3.92 mL 0  . folic acid (FOLVITE) 1 MG tablet Take 2 tablets (2 mg total) by mouth daily. 180 tablet 3  . losartan-hydrochlorothiazide (HYZAAR) 100-25 MG tablet Take 1 tablet by mouth daily.    . methotrexate 50 MG/2ML injection INJECT 0.8 MLS INTO THE SKIN ONCE A WEEK 4 mL 2  . Multiple Vitamin (MULTIVITAMIN) tablet Take 1 tablet by mouth daily.    . Multiple Vitamins-Minerals (ICAPS AREDS 2 PO) Take by mouth daily.    Marland Kitchen omeprazole (PRILOSEC) 40 MG capsule Take 40 mg by mouth daily.    Marland Kitchen topiramate (TOPAMAX) 25 MG capsule Take 25 mg by mouth daily.    . traMADol (ULTRAM) 50 MG tablet Take 1 tablet (50 mg total) by mouth at bedtime. (Patient taking differently: Take 50 mg by mouth every 12 (twelve) hours as needed. ) 30 tablet 0  . traZODone (DESYREL) 50 MG tablet TAKE 1 TABLET BY MOUTH AT BEDTIME (Patient taking differently: at bedtime as needed. ) 30 tablet 0  . venlafaxine (EFFEXOR) 75 MG tablet Take 75 mg by mouth once.     No current facility-administered medications for this visit.     Past Medical History:  Diagnosis Date  . Arthritis    rhemuatoid  . Hyperlipemia   . Hypertension   . Major depressive disorder   . 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 Type 2 diabetes mellitus (HCC)     Past Surgical History:  Procedure Laterality Date  . CESAREAN SECTION  1981 and 1985  . COLONOSCOPY N/A 01/01/2016   Procedure: COLONOSCOPY;  Surgeon: Malissa Hippo, MD;  Location: [facility];  Service: Endoscopy;  Laterality: N/A;  930  . FOOT SURGERY Left    with screws  . TONSILLECTOMY      Social History   Socioeconomic History  . Marital status: Married    Spouse name: Not on file  . Number of children: Not on file  . Years of education: Not on file  . Highest education level: Not on file  Occupational History  . Not on file  Social Needs  . Financial resource  strain: Not on file  . Food insecurity:    Worry: Not on file    Inability: Not on file  . Transportation needs:    Medical: Not on file    Non-medical: Not on file  Tobacco Use  . Smoking status: Never Smoker  . Smokeless tobacco: Never Used  Substance and Sexual Activity  . Alcohol use: Yes    Comment: rarely  . Drug use: No  . Sexual activity: Not on file  Lifestyle  . Physical activity:    Days per week: Not on file    Minutes per session: Not on file  . Stress: Not on file  Relationships  . Social connections:    Talks on phone: Not on file    Gets together: Not on file    Attends religious service: Not on file    Active member of club or organization: Not on file    Attends meetings of clubs or organizations: Not on file    Relationship status: Not on file  . Intimate partner violence:    Fear of current or ex partner: Not on file    Emotionally abused: Not on file    Physically abused: Not on file    Forced sexual activity: Not on file  Other Topics Concern  . Not on file  Social History Narrative  . Not on file     Vitals:   03/14/18 0901  BP: 112/84  Pulse: (!) 108  SpO2: 98%  Weight: 212 lb (96.2 kg)  Height: 5\' 6"  (1.676 m)    Wt Readings from Last 3 Encounters:  03/14/18 212 lb (96.2 kg)  12/22/17 205 lb (93 kg)  11/14/17 211 lb (95.7 kg)     PHYSICAL EXAM General: NAD HEENT: Normal. Neck: No JVD, no thyromegaly. Lungs: Clear to auscultation bilaterally with normal respiratory effort. CV: Regular rate and rhythm, normal S1/S2, no S3/S4, no murmur. No pretibial or periankle edema.     Abdomen: Soft, nontender, no distention.  Neurologic: Alert and oriented.  Psych: Normal affect. Skin: Normal. Musculoskeletal: No gross deformities.    ECG: Reviewed above under Subjective   Labs: Lab Results  Component Value Date/Time   K 4.0 01/18/2018 09:50 AM   BUN 23 01/18/2018 09:50 AM   BUN 28 (H) 09/03/2016 12:19 PM   CREATININE 0.91  01/18/2018 09:50 AM   ALT 14 01/18/2018 09:50 AM   HGB 12.2 01/18/2018 09:50 AM   HGB 12.2 09/03/2016 12:19 PM     Lipids: No results found for: LDLCALC, LDLDIRECT, CHOL, TRIG, HDL  ASSESSMENT AND PLAN:  1.  Coronary artery calcifications and chest pain: Normal nuclear stress test in June 2019 as noted above.  Strong family history of heart disease.  No further cardiac testing is indicated at this time.  2.  Hypertension: Controlled on present therapy which includes losartan and hydrochlorothiazide.  No changes.  3.  Rheumatoid arthritis: Currently on Enbrel and methotrexate.    4.  Bilateral feet pain: Likely related to rheumatoid arthritis.  I gave her some recommendations with respect to running shoes with adequate cushioning.   Disposition: Follow up 1 year.   Prentice Docker, M.D., F.A.C.C.

## 2018-03-21 ENCOUNTER — Other Ambulatory Visit: Payer: Self-pay | Admitting: Rheumatology

## 2018-03-21 NOTE — Telephone Encounter (Signed)
Last visit: 11/14/2017 Next visit: 04/21/2018 Labs: 01/18/2018 Glucose is 124. CBC stable. TB Gold: 09/23/2017 negative   Okay to refill per Dr. Deveshwar.  

## 2018-04-07 NOTE — Progress Notes (Deleted)
Office Visit Note  Patient: Shawna Williams             Date of Birth: [birth year]           MRN: [id number]             PCP: Kirstie Peri, MD Referring: Kirstie Peri, MD Visit Date: 04/21/2018 Occupation: @GUAROCC @  Subjective:  No chief complaint on file.   History of Present Illness: Shawna Williams is a 62 y.o. female ***   Activities of Daily Living:  Patient reports morning stiffness for *** {minute/hour:19697}.   Patient {ACTIONS;DENIES/REPORTS:21021675::"Denies"} nocturnal pain.  Difficulty dressing/grooming: {ACTIONS;DENIES/REPORTS:21021675::"Denies"} Difficulty climbing stairs: {ACTIONS;DENIES/REPORTS:21021675::"Denies"} Difficulty getting out of chair: {ACTIONS;DENIES/REPORTS:21021675::"Denies"} Difficulty using hands for taps, buttons, cutlery, and/or writing: {ACTIONS;DENIES/REPORTS:21021675::"Denies"}  No Rheumatology ROS completed.   PMFS History:  Patient Active Problem List   Diagnosis Date Noted  . 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 Primary osteoarthritis of both hands 06/28/2016  . Primary osteoarthritis of both feet 06/28/2016  . 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 High risk medication use 06/03/2016  . Myofascial muscle pain 06/03/2016  . Other fatigue 06/03/2016  . Other insomnia 06/03/2016  . Chronic pain syndrome 06/03/2016    Past Medical History:  Diagnosis Date  . Arthritis    rhemuatoid  . Hyperlipemia   . Hypertension   . Major depressive disorder   . 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 Type 2 diabetes mellitus (HCC)     Family History  Problem Relation Age of Onset  . Stroke Mother   . Diabetes Mother   . Heart attack Father   . Cancer Sister   . Diabetes Brother   . Diabetes Sister   . Heart attack Sister    Past Surgical History:  Procedure Laterality Date  . CESAREAN SECTION  1981 and 1985  . COLONOSCOPY N/A 01/01/2016   Procedure: COLONOSCOPY;  Surgeon: 01/03/2016, MD;  Location: [facility]  END[facility];  Service: Endoscopy;  Laterality: N/A;  930  . FOOT SURGERY Left    with screws  . TONSILLECTOMY     Social History   Social History Narrative  . Not on file    Objective: Vital Signs: There were no vitals taken for this visit.   Physical Exam   Musculoskeletal Exam: ***  CDAI Exam: CDAI Score: Not documented Patient Global Assessment: Not documented; Provider Global Assessment: Not documented Swollen: Not documented; Tender: Not documented Joint Exam   Not documented   There is currently no information documented on the homunculus. Go to the Rheumatology activity and complete the homunculus joint exam.  Investigation: No additional findings.  Imaging: No results found.  Recent Labs: Lab Results  Component Value Date   WBC 7.1 01/18/2018   HGB 12.2 01/18/2018   PLT 358 01/18/2018   NA 139 01/18/2018   K 4.0 01/18/2018   CL 105 01/18/2018   CO2 24 01/18/2018   GLUCOSE 124 (H) 01/18/2018   BUN 23 01/18/2018   CREATININE 0.91 01/18/2018   BILITOT 0.3 01/18/2018   ALKPHOS 88 09/03/2016   AST 15 01/18/2018   ALT 14 01/18/2018   PROT 7.0 01/18/2018   ALBUMIN 4.3 09/03/2016   CALCIUM 9.3 01/18/2018   GFRAA 79 01/18/2018   QFTBGOLD Negative 09/03/2016   QFTBGOLDPLUS NEGATIVE 09/23/2017    Speciality Comments: No specialty comments available.  Procedures:  No procedures performed Allergies: Diltiazem; Ketorolac; and Codeine   Assessment / Plan:     Visit Diagnoses: Seropositive  rheumatoid arthritis of multiple sites (HCC)  High risk medication use - Enbrel 50 mg sq q week, MTX 0.4 ml sq q wk , folic acid 1 mg po qd TB gold: 09/23/17  Primary osteoarthritis of both hands  Primary osteoarthritis of both feet  Chronic pain syndrome - tramadol 50 mg as needed  Myofascial muscle pain  Other fatigue  Other insomnia - Trazodone 50 mg at bedtime  History of asthma  History of migraine  History of depression  History of hypertension    Orders: No orders of the defined types were placed in this encounter.  No orders of the defined types were placed in this encounter.   Face-to-face time spent with patient was *** minutes. Greater than 50% of time was spent in counseling and coordination of care.  Follow-Up Instructions: No follow-ups on file.   Gearldine Bienenstock, PA-C  Note - This record has been created using Dragon software.  Chart creation errors have been sought, but may not always  have been located. Such creation errors do not reflect on  the standard of medical care.

## 2018-04-18 ENCOUNTER — Telehealth: Payer: Self-pay | Admitting: Rheumatology

## 2018-04-18 NOTE — Telephone Encounter (Signed)
FYI: Patient called to cancel rov on 04/21/2018. Per patient, she has transferred care to [facility].

## 2018-04-21 ENCOUNTER — Ambulatory Visit: Payer: BC Managed Care – PPO | Admitting: Physician Assistant

## 2018-05-14 ENCOUNTER — Encounter (HOSPITAL_COMMUNITY): Payer: Self-pay

## 2018-05-14 ENCOUNTER — Other Ambulatory Visit: Payer: Self-pay

## 2018-05-14 ENCOUNTER — Emergency Department (HOSPITAL_COMMUNITY): Payer: BC Managed Care – PPO

## 2018-05-14 ENCOUNTER — Emergency Department (HOSPITAL_COMMUNITY)
Admission: EM | Admit: 2018-05-14 | Discharge: 2018-05-14 | Disposition: A | Discharge status: Home / Self Care | Payer: BC Managed Care – PPO | Attending: Emergency Medicine | Admitting: Emergency Medicine

## 2018-05-14 DIAGNOSIS — K8080 Other cholelithiasis without obstruction: Secondary | ICD-10-CM | POA: Insufficient documentation

## 2018-05-14 DIAGNOSIS — I10 Essential (primary) hypertension: Secondary | ICD-10-CM | POA: Insufficient documentation

## 2018-05-14 DIAGNOSIS — E119 Type 2 diabetes mellitus without complications: Secondary | ICD-10-CM | POA: Diagnosis not present

## 2018-05-14 DIAGNOSIS — R1011 Right upper quadrant pain: Secondary | ICD-10-CM | POA: Diagnosis present

## 2018-05-14 DIAGNOSIS — M069 Rheumatoid arthritis, unspecified: Secondary | ICD-10-CM | POA: Insufficient documentation

## 2018-05-14 DIAGNOSIS — Z79899 Other long term (current) drug therapy: Secondary | ICD-10-CM | POA: Diagnosis not present

## 2018-05-14 LAB — URINALYSIS, ROUTINE W REFLEX MICROSCOPIC
Bacteria, UA: NONE SEEN
Bilirubin Urine: NEGATIVE
Glucose, UA: NEGATIVE mg/dL
Hgb urine dipstick: NEGATIVE
Ketones, ur: NEGATIVE mg/dL
Nitrite: NEGATIVE
Protein, ur: NEGATIVE mg/dL
Specific Gravity, Urine: 1.024 (ref 1.005–1.030)
pH: 5 (ref 5.0–8.0)

## 2018-05-14 LAB — CBC
HCT: 38.7 % (ref 36.0–46.0)
Hemoglobin: 12.4 g/dL (ref 12.0–15.0)
MCH: 30.4 pg (ref 26.0–34.0)
MCHC: 32 g/dL (ref 30.0–36.0)
MCV: 94.9 fL (ref 80.0–100.0)
Platelets: 344 10*3/uL (ref 150–400)
RBC: 4.08 MIL/uL (ref 3.87–5.11)
RDW: 14.2 % (ref 11.5–15.5)
WBC: 9.2 10*3/uL (ref 4.0–10.5)
nRBC: 0 % (ref 0.0–0.2)

## 2018-05-14 LAB — COMPREHENSIVE METABOLIC PANEL
ALT: 21 U/L (ref 0–44)
AST: 23 U/L (ref 15–41)
Albumin: 4.2 g/dL (ref 3.5–5.0)
Alkaline Phosphatase: 79 U/L (ref 38–126)
Anion gap: 9 (ref 5–15)
BUN: 16 mg/dL (ref 8–23)
CO2: 22 mmol/L (ref 22–32)
Calcium: 9.7 mg/dL (ref 8.9–10.3)
Chloride: 109 mmol/L (ref 98–111)
Creatinine, Ser: 0.91 mg/dL (ref 0.44–1.00)
GFR calc Af Amer: >60 mL/min (ref >60)
GFR calc non Af Amer: >60 mL/min (ref >60)
Glucose, Bld: 157 mg/dL — ABNORMAL HIGH (ref 70–99)
Potassium: 3.6 mmol/L (ref 3.5–5.1)
Sodium: 140 mmol/L (ref 135–145)
Total Bilirubin: 0.7 mg/dL (ref 0.3–1.2)
Total Protein: 7.7 g/dL (ref 6.5–8.1)

## 2018-05-14 LAB — LIPASE, BLOOD: Lipase: 33 U/L (ref 11–51)

## 2018-05-14 MED ORDER — ONDANSETRON HCL 4 MG PO TABS: 4.0000 mg | ORAL_TABLET | Freq: Four times a day (QID) | ORAL | 0 refills | Status: DC

## 2018-05-14 MED ORDER — ONDANSETRON HCL 4 MG/2ML IJ SOLN
4.0000 mg | Freq: Once | INTRAMUSCULAR | Status: AC
  Administered 2018-05-14: 4 mg via INTRAVENOUS
  Filled 2018-05-14: qty 2

## 2018-05-14 MED ORDER — HYDROMORPHONE HCL 1 MG/ML IJ SOLN
1.0000 mg | Freq: Once | INTRAMUSCULAR | Status: AC
  Administered 2018-05-14: 1 mg via INTRAVENOUS
  Filled 2018-05-14: qty 1

## 2018-05-14 MED ORDER — SODIUM CHLORIDE 0.9 % IV BOLUS
1000.0000 mL | Freq: Once | INTRAVENOUS | Status: AC
  Administered 2018-05-14: 1000 mL via INTRAVENOUS

## 2018-05-14 MED ORDER — MORPHINE SULFATE (PF) 4 MG/ML IV SOLN
4.0000 mg | Freq: Once | INTRAVENOUS | Status: AC
  Administered 2018-05-14: 4 mg via INTRAVENOUS
  Filled 2018-05-14: qty 1

## 2018-05-14 MED ORDER — OXYCODONE-ACETAMINOPHEN 5-325 MG PO TABS: 1.0000 | ORAL_TABLET | ORAL | 0 refills | Status: DC | PRN

## 2018-05-14 MED ORDER — IOPAMIDOL (ISOVUE-300) INJECTION 61%
100.0000 mL | Freq: Once | INTRAVENOUS | Status: AC | PRN
  Administered 2018-05-14: 100 mL via INTRAVENOUS

## 2018-05-14 NOTE — ED Triage Notes (Signed)
Complaints of right upper quadrant pain starting at 1PM today with nausea, and belching. Does have HX of gallstones.

## 2018-05-14 NOTE — Discharge Instructions (Addendum)
Call general surgeon tomorrow morning for follow-up.  Phone number given.  Prescription for pain and nausea medicine.  Avoid greasy foods.

## 2018-05-15 NOTE — ED Provider Notes (Signed)
[facility] EMERGENCY DEPARTMENT Provider Note   CSN: [id number] Arrival date & time: 05/14/18  1632     History   Chief Complaint Chief Complaint  Patient presents with  . Abdominal Pain    HPI Shawna Williams is a 62 y.o. female.  Right upper quadrant pain with radiation to the back with associated nausea at approximately 1 PM today.  She has a known history of gallstones.  No fever, sweats, chills, jaundice.  Severity of pain is moderate.  Nothing makes symptoms better or worse.     Past Medical History:  Diagnosis Date  . Arthritis    rhemuatoid  . Hyperlipemia   . Hypertension   . Major depressive disorder   . 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 Type 2 diabetes mellitus [facility])     Patient Active Problem List   Diagnosis Date Noted  . 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 Primary osteoarthritis of both hands 06/28/2016  . Primary osteoarthritis of both feet 06/28/2016  . 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 High risk medication use 06/03/2016  . Myofascial muscle pain 06/03/2016  . Other fatigue 06/03/2016  . Other insomnia 06/03/2016  . Chronic pain syndrome 06/03/2016    Past Surgical History:  Procedure Laterality Date  . CESAREAN SECTION  1981 and 1985  . COLONOSCOPY N/A 01/01/2016   Procedure: COLONOSCOPY;  Surgeon: Malissa Hippo, MD;  Location: [facility];  Service: Endoscopy;  Laterality: N/A;  930  . FOOT SURGERY Left    with screws  . TONSILLECTOMY       OB History   None      Home Medications    Prior to Admission medications   Medication Sig Start Date End Date Taking? Authorizing Provider  calcium carbonate 1250 MG capsule Take by mouth daily.    [provider]  cholecalciferol (VITAMIN D) 1000 units tablet Take 5,000 Units by mouth. Saturday and Sunday    [provider]  Cinnamon 500 MG capsule Take 1,000 mg by mouth daily.    [provider]  diclofenac  sodium (VOLTAREN) 1 % GEL Voltaren Gel 3 grams to 3 large joints upto TID 3 TUBES with 3 refills 10/03/17   Pollyann Savoy, MD  ENBREL SURECLICK 50 MG/ML injection INJECT 1 PEN UNDER THE SKIN EVERY 7 DAYS. 03/21/18   Pollyann Savoy, MD  folic acid (FOLVITE) 1 MG tablet Take 2 tablets (2 mg total) by mouth daily. 11/14/17   Gearldine Bienenstock, PA-C  losartan-hydrochlorothiazide (HYZAAR) 100-25 MG tablet Take 1 tablet by mouth daily. 05/30/17   [provider]  methotrexate 50 MG/2ML injection INJECT 0.8 MLS INTO THE SKIN ONCE A WEEK 11/14/17   Gearldine Bienenstock, PA-C  Multiple Vitamin (MULTIVITAMIN) tablet Take 1 tablet by mouth daily.    [provider]  Multiple Vitamins-Minerals (ICAPS AREDS 2 PO) Take by mouth daily.    [provider]  omeprazole (PRILOSEC) 40 MG capsule Take 40 mg by mouth daily.    [provider]  ondansetron (ZOFRAN) 4 MG tablet Take 1 tablet (4 mg total) by mouth every 6 (six) hours. 05/14/18   Donnetta Hutching, MD  oxyCODONE-acetaminophen (PERCOCET) 5-325 MG tablet Take 1 tablet by mouth every 4 (four) hours as needed. 05/14/18   Donnetta Hutching, MD  topiramate (TOPAMAX) 25 MG capsule Take 25 mg by mouth daily.    [provider]  traMADol (ULTRAM) 50 MG tablet Take 1 tablet (50 mg  total) by mouth at bedtime. Patient taking differently: Take 50 mg by mouth every 12 (twelve) hours as needed.  01/17/18   Gearldine Bienenstock, PA-C  traZODone (DESYREL) 50 MG tablet TAKE 1 TABLET BY MOUTH AT BEDTIME Patient taking differently: at bedtime as needed.  11/24/17   Gearldine Bienenstock, PA-C  venlafaxine (EFFEXOR) 75 MG tablet Take 75 mg by mouth once.    [provider]    Family History Family History  Problem Relation Age of Onset  . Stroke Mother   . Diabetes Mother   . Heart attack Father   . Cancer Sister   . Diabetes Brother   . Diabetes Sister   . Heart attack Sister     Social History Social History   Tobacco Use  . Smoking status:  Never Smoker  . Smokeless tobacco: Never Used  Substance Use Topics  . Alcohol use: Not Currently    Comment: rarely  . Drug use: No     Allergies   Diltiazem; Ketorolac; and Codeine   Review of Systems Review of Systems  All other systems reviewed and are negative.    Physical Exam Updated Vital Signs BP (!) 155/75   Pulse 78   Temp (!) 97.5 F (36.4 C) (Oral)   Resp 18   Ht 5\' 6"  (1.676 m)   Wt 96.2 kg   SpO2 96%   BMI 34.22 kg/m   Physical Exam  Constitutional: She is oriented to person, place, and time. She appears well-developed and well-nourished.  HENT:  Head: Normocephalic and atraumatic.  Eyes: Conjunctivae are normal.  Neck: Neck supple.  Cardiovascular: Normal rate and regular rhythm.  Pulmonary/Chest: Effort normal and breath sounds normal.  Abdominal: Soft. Bowel sounds are normal.  Tender right upper quadrant  Musculoskeletal: Normal range of motion.  Neurological: She is alert and oriented to person, place, and time.  Skin: Skin is warm and dry.  Psychiatric: She has a normal mood and affect. Her behavior is normal.  Nursing note and vitals reviewed.    ED Treatments / Results  Labs (all labs ordered are listed, but only abnormal results are displayed) Labs Reviewed  COMPREHENSIVE METABOLIC PANEL - Abnormal; Notable for the following components:      Result Value   Glucose, Bld 157 (*)    All other components within normal limits  URINALYSIS, ROUTINE W REFLEX MICROSCOPIC - Abnormal; Notable for the following components:   APPearance HAZY (*)    Leukocytes, UA LARGE (*)    All other components within normal limits  LIPASE, BLOOD  CBC    EKG None  Radiology Ct Abdomen Pelvis W Contrast  Result Date: 05/14/2018 CLINICAL DATA:  62 year old female with upper abdominal pain. EXAM: CT ABDOMEN AND PELVIS WITH CONTRAST TECHNIQUE: Multidetector CT imaging of the abdomen and pelvis was performed using the standard protocol following bolus  administration of intravenous contrast. CONTRAST:  ISOVUE-300 IOPAMIDOL (ISOVUE-300) INJECTION 61% COMPARISON:  None. FINDINGS: Lower chest: The visualized lung bases are clear. No intra-abdominal free air or free fluid. Hepatobiliary: The liver is unremarkable. There are multiple stones within the gallbladder including gallbladder neck. There is mild distention of the gallbladder with apparent mild thickening of the gallbladder wall and trace pericholecystic fluid. Ultrasound is recommended for better evaluation of the gallbladder. Pancreas: Unremarkable. No pancreatic ductal dilatation or surrounding inflammatory changes. Spleen: Normal in size without focal abnormality. Adrenals/Urinary Tract: The adrenal glands are unremarkable. The kidneys, visualized ureters, and urinary bladder appear unremarkable  as well. Stomach/Bowel: There is sigmoid diverticulosis without active inflammatory changes. No bowel obstruction or active inflammation. Normal appendix. Vascular/Lymphatic: No significant vascular findings are present. No enlarged abdominal or pelvic lymph nodes. Reproductive: The uterus and ovaries are grossly unremarkable as visualized. No pelvic mass. Other: Small fat containing umbilical hernia. Musculoskeletal: There is degenerative changes of the spine. Age indeterminate, likely chronic L1 compression fracture with anterior wedging and resulting mild focal kyphosis of the lumbar spine. Chronic appearing compression deformity of the superior endplate of L5. There is grade 1 L4-L5 anterolisthesis. IMPRESSION: 1. Cholelithiasis with findings concerning for an early acute cholecystitis. Ultrasound is recommended for better evaluation of the gallbladder. 2. Sigmoid diverticulosis. No bowel obstruction or active inflammation. Normal appendix. Electronically Signed   By: Elgie Collard M.D.   On: 05/14/2018 22:34    Procedures Procedures (including critical care time)  Medications Ordered in  ED Medications  ondansetron (ZOFRAN) injection 4 mg (4 mg Intravenous Given 05/14/18 2118)  sodium chloride 0.9 % bolus 1,000 mL (0 mLs Intravenous Stopped 05/14/18 2235)  morphine 4 MG/ML injection 4 mg (4 mg Intravenous Given 05/14/18 2118)  iopamidol (ISOVUE-300) 61 % injection 100 mL (100 mLs Intravenous Contrast Given 05/14/18 2156)  HYDROmorphone (DILAUDID) injection 1 mg (1 mg Intravenous Given 05/14/18 2332)     Initial Impression / Assessment and Plan / ED Course  I have reviewed the triage vital signs and the nursing notes.  Pertinent labs & imaging results that were available during my care of the patient were reviewed by me and considered in my medical decision making (see chart for details).     History and physical consistent with gallbladder related pain.  CT scan confirms cholelithiasis, possible early cholecystitis.  White count and liver functions normal.  Urinalysis shows 21-50 white cells.  05/15/18 @ 1915: I attempted to call patient at [phone] to discuss her urinalysis from last night.  I left a message for her to call me back.  1920: Spoke with patient about her urinalysis.  Called in Rx to [facility] [phone].  Keflex 500 mg 4 times a day #20.  Final Clinical Impressions(s) / ED Diagnoses   Final diagnoses:  Biliary calculus of other site without obstruction    ED Discharge Orders         Ordered    oxyCODONE-acetaminophen (PERCOCET) 5-325 MG tablet  Every 4 hours PRN     05/14/18 2323    ondansetron (ZOFRAN) 4 MG tablet  Every 6 hours     05/14/18 2323           Donnetta Hutching, MD 05/15/18 1928

## 2018-05-16 ENCOUNTER — Ambulatory Visit: Payer: BC Managed Care – PPO | Admitting: General Surgery

## 2018-05-16 ENCOUNTER — Encounter: Payer: Self-pay | Admitting: General Surgery

## 2018-05-16 VITALS — BP 130/80 | HR 86 | Temp 98.6°F | Resp 18 | Wt 208.0 lb

## 2018-05-16 DIAGNOSIS — K8020 Calculus of gallbladder without cholecystitis without obstruction: Secondary | ICD-10-CM

## 2018-05-16 DIAGNOSIS — K8010 Calculus of gallbladder with chronic cholecystitis without obstruction: Secondary | ICD-10-CM | POA: Diagnosis not present

## 2018-05-16 NOTE — H&P (Signed)
[facility] History and Physical  Reason for Referral: Gallstones/ Cholecystitis on CT  Referring Physician: Dr. Lacinda Axon (ED)    Shawna Williams is a 62 y.o. female.  HPI: Shawna Williams is a 62 yo with known gallstones that went to the ED 2 days ago with abdominal pain, nausea/vomiting that was getting worse and not being relieved. She is still having pain and reports she has had some degree of pain for the past 3 weeks but that it normally got better. She has known that she had stones in the past, and has been seen by GI in the past for reflux type symptoms. She reports that since Sunday when she went to the ED that she has not gotten any better.  The patient has been taking in minimal oral intake.    She was also diagnosed with a UTI and is being treated with Keflex during the ED visit.       Past Medical History:  Diagnosis Date  . Arthritis    rhemuatoid  . Hyperlipemia   . Hypertension   . Major depressive disorder   . 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 Type 2 diabetes mellitus ([place])          Past Surgical History:  Procedure Laterality Date  . [place]  . COLONOSCOPY N/A 01/01/2016   Procedure: COLONOSCOPY;  Surgeon: Rogene Houston, MD;  Location: [facility];  Service: Endoscopy;  Laterality: N/A;  930  . FOOT SURGERY Left    with screws  . TONSILLECTOMY           Family History  Problem Relation Age of Onset  . Stroke Mother   . Diabetes Mother   . Heart attack Father   . Cancer Sister   . Diabetes Brother   . Diabetes Sister   . Heart attack Sister     Social History        Tobacco Use  . Smoking status: Never Smoker  . Smokeless tobacco: Never Used  Substance Use Topics  . Alcohol use: Not Currently    Comment: rarely  . Drug use: No    Medications: I have reviewed the patient's current medications.      Allergies as of 05/16/2018      Reactions   Diltiazem     Other reaction(s): fatigue   Ketorolac    ? EDEMA, SOB   Codeine Nausea Only               Medication List            Accurate as of 05/16/18 12:50 PM. Always use your most recent med list.           calcium carbonate 1250 MG capsule Take by mouth daily.   cholecalciferol 1000 units tablet Commonly known as:  VITAMIN D Take 5,000 Units by mouth. Saturday and Sunday   Cinnamon 500 MG capsule Take 1,000 mg by mouth daily.   diclofenac sodium 1 % Gel Commonly known as:  VOLTAREN Voltaren Gel 3 grams to 3 large joints upto TID 3 TUBES with 3 refills   ENBREL SURECLICK 50 MG/ML injection Generic drug:  etanercept INJECT 1 PEN UNDER THE SKIN EVERY 7 DAYS.   folic acid 1 MG tablet Commonly known as:  FOLVITE Take 2 tablets (2 mg total) by mouth daily.   ICAPS AREDS 2 PO Take by mouth daily.   losartan-hydrochlorothiazide 100-25 MG tablet Commonly known as:  HYZAAR  Take 1 tablet by mouth daily.   methotrexate 50 MG/2ML injection INJECT 0.8 MLS INTO THE SKIN ONCE A WEEK   multivitamin tablet Take 1 tablet by mouth daily.   omeprazole 40 MG capsule Commonly known as:  PRILOSEC Take 40 mg by mouth daily.   ondansetron 4 MG tablet Commonly known as:  ZOFRAN Take 1 tablet (4 mg total) by mouth every 6 (six) hours.   oxyCODONE-acetaminophen 5-325 MG tablet Commonly known as:  PERCOCET/ROXICET Take 1 tablet by mouth every 4 (four) hours as needed.   topiramate 25 MG capsule Commonly known as:  TOPAMAX Take 25 mg by mouth daily.   traMADol 50 MG tablet Commonly known as:  ULTRAM Take 1 tablet (50 mg total) by mouth at bedtime.   traZODone 50 MG tablet Commonly known as:  DESYREL TAKE 1 TABLET BY MOUTH AT BEDTIME   venlafaxine 75 MG tablet Commonly known as:  EFFEXOR Take 75 mg by mouth once.        ROS:  A comprehensive review of systems was negative except for: Gastrointestinal: positive for abdominal pain, reflux  symptoms, vomiting and Hepatitis C diagnosis/ new Musculoskeletal: positive for back pain, neck pain and stiff joints Allergic/Immunologic: positive for hay fever  Blood pressure 130/80, pulse 86, temperature 98.6 F (37 C), temperature source Temporal, resp. rate 18, weight 208 lb (94.3 kg). Physical Exam  Constitutional: She is oriented to person, place, and time. She appears well-developed and well-nourished.  HENT:  Head: Normocephalic and atraumatic.  Eyes: Pupils are equal, round, and reactive to light. EOM are normal.  Neck: Normal range of motion.  Cardiovascular: Normal rate and regular rhythm.  Pulmonary/Chest: Effort normal and breath sounds normal.  Abdominal: Soft. She exhibits no distension. There is no tenderness.  Musculoskeletal: Normal range of motion. She exhibits no edema.  Neurological: She is alert and oriented to person, place, and time.  Skin: Skin is warm and dry.  Psychiatric: She has a normal mood and affect. Her behavior is normal. Judgment and thought content normal.  Vitals reviewed.   Results: LabResultsLast48Hours        Results for orders placed or performed during the hospital encounter of 05/14/18 (from the past 48 hour(s))  Lipase, blood     Status: None   Collection Time: 05/14/18  6:41 PM  Result Value Ref Range   Lipase 33 11 - 51 U/L    Comment: Performed at [facility], [address], [place], [place] [address]  Comprehensive metabolic panel     Status: Abnormal   Collection Time: 05/14/18  6:41 PM  Result Value Ref Range   Sodium 140 135 - 145 mmol/L   Potassium 3.6 3.5 - 5.1 mmol/L   Chloride 109 98 - 111 mmol/L   CO2 22 22 - 32 mmol/L   Glucose, Bld 157 (H) 70 - 99 mg/dL   BUN 16 8 - 23 mg/dL   Creatinine, Ser 0.91 0.44 - 1.00 mg/dL   Calcium 9.7 8.9 - 10.3 mg/dL   Total Protein 7.7 6.5 - 8.1 g/dL   Albumin 4.2 3.5 - 5.0 g/dL   AST 23 15 - 41 U/L   ALT 21 0 - 44 U/L   Alkaline Phosphatase 79 38 - 126  U/L   Total Bilirubin 0.7 0.3 - 1.2 mg/dL   GFR calc non Af Amer >60 >60 mL/min   GFR calc Af Amer >60 >60 mL/min    Comment: (NOTE) The eGFR has been calculated using the  CKD EPI equation. This calculation has not been validated in all clinical situations. eGFR's persistently <60 mL/min signify possible Chronic Kidney Disease.    Anion gap 9 5 - 15    Comment: Performed at [facility], [address] [place], [place] [address]  CBC     Status: None   Collection Time: 05/14/18  6:41 PM  Result Value Ref Range   WBC 9.2 4.0 - 10.5 K/uL   RBC 4.08 3.87 - 5.11 MIL/uL   Hemoglobin 12.4 12.0 - 15.0 g/dL   HCT 38.7 36.0 - 46.0 %   MCV 94.9 80.0 - 100.0 fL   MCH 30.4 26.0 - 34.0 pg   MCHC 32.0 30.0 - 36.0 g/dL   RDW 14.2 11.5 - 15.5 %   Platelets 344 150 - 400 K/uL   nRBC 0.0 0.0 - 0.2 %    Comment: Performed at [facility], [address], [place], [place] [address]  Urinalysis, Routine w reflex microscopic     Status: Abnormal   Collection Time: 05/14/18  9:30 PM  Result Value Ref Range   Color, Urine YELLOW YELLOW   APPearance HAZY (A) CLEAR   Specific Gravity, Urine 1.024 1.005 - 1.030   pH 5.0 5.0 - 8.0   Glucose, UA NEGATIVE NEGATIVE mg/dL   Hgb urine dipstick NEGATIVE NEGATIVE   Bilirubin Urine NEGATIVE NEGATIVE   Ketones, ur NEGATIVE NEGATIVE mg/dL   Protein, ur NEGATIVE NEGATIVE mg/dL   Nitrite NEGATIVE NEGATIVE   Leukocytes, UA LARGE (A) NEGATIVE   RBC / HPF 0-5 0 - 5 RBC/hpf   WBC, UA 21-50 0 - 5 WBC/hpf   Bacteria, UA NONE SEEN NONE SEEN   Squamous Epithelial / LPF 0-5 0 - 5   Mucus PRESENT    Hyaline Casts, UA PRESENT     Comment: Performed at [facility], [address], [place],  [address]      Personally reviewed Ct scan- gallbladder distended with stones, thickening of the wall and some fluid   ImagingResults(Last48hours)  Ct Abdomen Pelvis W Contrast  Result Date: 05/14/2018 CLINICAL DATA:   62 year old female with upper abdominal pain. EXAM: CT ABDOMEN AND PELVIS WITH CONTRAST TECHNIQUE: Multidetector CT imaging of the abdomen and pelvis was performed using the standard protocol following bolus administration of intravenous contrast. CONTRAST:  11m ISOVUE-300 IOPAMIDOL (ISOVUE-300) INJECTION 61% COMPARISON:  None. FINDINGS: Lower chest: The visualized lung bases are clear. No intra-abdominal free air or free fluid. Hepatobiliary: The liver is unremarkable. There are multiple stones within the gallbladder including gallbladder neck. There is mild distention of the gallbladder with apparent mild thickening of the gallbladder wall and trace pericholecystic fluid. Ultrasound is recommended for better evaluation of the gallbladder. Pancreas: Unremarkable. No pancreatic ductal dilatation or surrounding inflammatory changes. Spleen: Normal in size without focal abnormality. Adrenals/Urinary Tract: The adrenal glands are unremarkable. The kidneys, visualized ureters, and urinary bladder appear unremarkable as well. Stomach/Bowel: There is sigmoid diverticulosis without active inflammatory changes. No bowel obstruction or active inflammation. Normal appendix. Vascular/Lymphatic: No significant vascular findings are present. No enlarged abdominal or pelvic lymph nodes. Reproductive: The uterus and ovaries are grossly unremarkable as visualized. No pelvic mass. Other: Small fat containing umbilical hernia. Musculoskeletal: There is degenerative changes of the spine. Age indeterminate, likely chronic L1 compression fracture with anterior wedging and resulting mild focal kyphosis of the lumbar spine. Chronic appearing compression deformity of the superior endplate of L5. There is grade 1 L4-L5 anterolisthesis. IMPRESSION: 1. Cholelithiasis with findings concerning  for an early acute cholecystitis. Ultrasound is recommended for better evaluation of the gallbladder. 2. Sigmoid diverticulosis. No bowel obstruction  or active inflammation. Normal appendix. Electronically Signed   By: Anner Crete M.D.   On: 05/14/2018 22:34      Results for EVETT, KASSA (MRN [id number]) as of 05/16/2018 11:31  Ref. Range 05/14/2018 18:41  Sodium Latest Ref Range: 135 - 145 mmol/L 140  Potassium Latest Ref Range: 3.5 - 5.1 mmol/L 3.6  Chloride Latest Ref Range: 98 - 111 mmol/L 109  CO2 Latest Ref Range: 22 - 32 mmol/L 22  Glucose Latest Ref Range: 70 - 99 mg/dL 157 (H)  BUN Latest Ref Range: 8 - 23 mg/dL 16  Creatinine Latest Ref Range: 0.44 - 1.00 mg/dL 0.91  Calcium Latest Ref Range: 8.9 - 10.3 mg/dL 9.7  Anion gap Latest Ref Range: 5 - 15  9  Alkaline Phosphatase Latest Ref Range: 38 - 126 U/L 79  Albumin Latest Ref Range: 3.5 - 5.0 g/dL 4.2  Lipase Latest Ref Range: 11 - 51 U/L 33  AST Latest Ref Range: 15 - 41 U/L 23  ALT Latest Ref Range: 0 - 44 U/L 21  Total Protein Latest Ref Range: 6.5 - 8.1 g/dL 7.7  Total Bilirubin Latest Ref Range: 0.3 - 1.2 mg/dL 0.7  GFR, Est Non African American Latest Ref Range: >60 mL/min >60  GFR, Est African American Latest Ref Range: >60 mL/min >60   Results for MYLENE, BOW (MRN [id number]) as of 05/16/2018 11:31  Ref. Range 05/14/2018 18:41  WBC Latest Ref Range: 4.0 - 10.5 K/uL 9.2  RBC Latest Ref Range: 3.87 - 5.11 MIL/uL 4.08  Hemoglobin Latest Ref Range: 12.0 - 15.0 g/dL 12.4  HCT Latest Ref Range: 36.0 - 46.0 % 38.7  MCV Latest Ref Range: 80.0 - 100.0 fL 94.9  MCH Latest Ref Range: 26.0 - 34.0 pg 30.4  MCHC Latest Ref Range: 30.0 - 36.0 g/dL 32.0  RDW Latest Ref Range: 11.5 - 15.5 % 14.2  Platelets Latest Ref Range: 150 - 400 K/uL 344  nRBC Latest Ref Range: 0.0 - 0.2 % 0.0    Assessment & Plan:  VASHON ARCH is a 62 y.o. female with cholelithiasis/ biliary colic and potentially cholecystitis based on the CT scan, LFTs wnl 2 days ago. She continues to have abdominal pain, nausea/vomiting. Given this we will proceed with surgery as soon as possible.  She has a history of RA but has been off her methotrexate for over 2 weeks. She has the recent diagnosis of Hepatitis C and will be seeing GI. She was diagnosed with this after screening given her RA medications and as above her LFTs are normal.   -Lap cholecystectomy possible open   PLAN: I counseled the patient about the indication, risks and benefits of laparoscopic cholecystectomy.  She understands there is a very small chance for bleeding, infection, injury to normal structures (including common bile duct), conversion to open surgery, persistent symptoms, evolution of postcholecystectomy diarrhea, need for secondary interventions, anesthesia reaction, cardiopulmonary issues and other risks not specifically detailed here. I described the expected recovery, the plan for follow-up and the restrictions during the recovery phase.  All questions were answered.    Virl Cagey 05/16/2018, 12:50 PM

## 2018-05-16 NOTE — Progress Notes (Signed)
[facility] History and Physical  Reason for Referral: Gallstones/ Cholecystitis on CT  Referring Physician: Dr. Lacinda Axon (ED)     Shawna Williams is a 62 y.o. female.  HPI: Shawna Williams is a 62 yo with known gallstones that went to the ED 2 days ago with abdominal pain, nausea/vomiting that was getting worse and not being relieved. She is still having pain and reports she has had some degree of pain for the past 3 weeks but that it normally got better. She has known that she had stones in the past, and has been seen by GI in the past for reflux type symptoms. She reports that since Sunday when she went to the ED that she has not gotten any better.  The patient has been taking in minimal oral intake.    She was also diagnosed with a UTI and is being treated with Keflex during the ED visit.   Past Medical History:  Diagnosis Date  . Arthritis    rhemuatoid  . Hyperlipemia   . Hypertension   . Major depressive disorder   . 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 Type 2 diabetes mellitus ([place])     Past Surgical History:  Procedure Laterality Date  . [place]  . COLONOSCOPY N/A 01/01/2016   Procedure: COLONOSCOPY;  Surgeon: Rogene Houston, MD;  Location: [facility];  Service: Endoscopy;  Laterality: N/A;  930  . FOOT SURGERY Left    with screws  . TONSILLECTOMY      Family History  Problem Relation Age of Onset  . Stroke Mother   . Diabetes Mother   . Heart attack Father   . Cancer Sister   . Diabetes Brother   . Diabetes Sister   . Heart attack Sister     Social History   Tobacco Use  . Smoking status: Never Smoker  . Smokeless tobacco: Never Used  Substance Use Topics  . Alcohol use: Not Currently    Comment: rarely  . Drug use: No    Medications: I have reviewed the patient's current medications. Allergies as of 05/16/2018      Reactions   Diltiazem    Other reaction(s): fatigue   Ketorolac    ? EDEMA, SOB   Codeine  Nausea Only      Medication List        Accurate as of 05/16/18 12:50 PM. Always use your most recent med list.          calcium carbonate 1250 MG capsule Take by mouth daily.   cholecalciferol 1000 units tablet Commonly known as:  VITAMIN D Take 5,000 Units by mouth. Saturday and Sunday   Cinnamon 500 MG capsule Take 1,000 mg by mouth daily.   diclofenac sodium 1 % Gel Commonly known as:  VOLTAREN Voltaren Gel 3 grams to 3 large joints upto TID 3 TUBES with 3 refills   ENBREL SURECLICK 50 MG/ML injection Generic drug:  etanercept INJECT 1 PEN UNDER THE SKIN EVERY 7 DAYS.   folic acid 1 MG tablet Commonly known as:  FOLVITE Take 2 tablets (2 mg total) by mouth daily.   ICAPS AREDS 2 PO Take by mouth daily.   losartan-hydrochlorothiazide 100-25 MG tablet Commonly known as:  HYZAAR Take 1 tablet by mouth daily.   methotrexate 50 MG/2ML injection INJECT 0.8 MLS INTO THE SKIN ONCE A WEEK   multivitamin tablet Take 1 tablet by mouth daily.   omeprazole 40 MG capsule  Commonly known as:  PRILOSEC Take 40 mg by mouth daily.   ondansetron 4 MG tablet Commonly known as:  ZOFRAN Take 1 tablet (4 mg total) by mouth every 6 (six) hours.   oxyCODONE-acetaminophen 5-325 MG tablet Commonly known as:  PERCOCET/ROXICET Take 1 tablet by mouth every 4 (four) hours as needed.   topiramate 25 MG capsule Commonly known as:  TOPAMAX Take 25 mg by mouth daily.   traMADol 50 MG tablet Commonly known as:  ULTRAM Take 1 tablet (50 mg total) by mouth at bedtime.   traZODone 50 MG tablet Commonly known as:  DESYREL TAKE 1 TABLET BY MOUTH AT BEDTIME   venlafaxine 75 MG tablet Commonly known as:  EFFEXOR Take 75 mg by mouth once.        ROS:  A comprehensive review of systems was negative except for: Gastrointestinal: positive for abdominal pain, reflux symptoms, vomiting and Hepatitis C diagnosis/ new Musculoskeletal: positive for back pain, neck pain and stiff  joints Allergic/Immunologic: positive for hay fever  Blood pressure 130/80, pulse 86, temperature 98.6 F (37 C), temperature source Temporal, resp. rate 18, weight 208 lb (94.3 kg). Physical Exam  Constitutional: She is oriented to person, place, and time. She appears well-developed and well-nourished.  HENT:  Head: Normocephalic and atraumatic.  Eyes: Pupils are equal, round, and reactive to light. EOM are normal.  Neck: Normal range of motion.  Cardiovascular: Normal rate and regular rhythm.  Pulmonary/Chest: Effort normal and breath sounds normal.  Abdominal: Soft. She exhibits no distension. There is no tenderness.  Musculoskeletal: Normal range of motion. She exhibits no edema.  Neurological: She is alert and oriented to person, place, and time.  Skin: Skin is warm and dry.  Psychiatric: She has a normal mood and affect. Her behavior is normal. Judgment and thought content normal.  Vitals reviewed.   Results: Results for orders placed or performed during the hospital encounter of 05/14/18 (from the past 48 hour(s))  Lipase, blood     Status: None   Collection Time: 05/14/18  6:41 PM  Result Value Ref Range   Lipase 33 11 - 51 U/L    Comment: Performed at [facility], [address], [place], [place] [address]  Comprehensive metabolic panel     Status: Abnormal   Collection Time: 05/14/18  6:41 PM  Result Value Ref Range   Sodium 140 135 - 145 mmol/L   Potassium 3.6 3.5 - 5.1 mmol/L   Chloride 109 98 - 111 mmol/L   CO2 22 22 - 32 mmol/L   Glucose, Bld 157 (H) 70 - 99 mg/dL   BUN 16 8 - 23 mg/dL   Creatinine, Ser 0.91 0.44 - 1.00 mg/dL   Calcium 9.7 8.9 - 10.3 mg/dL   Total Protein 7.7 6.5 - 8.1 g/dL   Albumin 4.2 3.5 - 5.0 g/dL   AST 23 15 - 41 U/L   ALT 21 0 - 44 U/L   Alkaline Phosphatase 79 38 - 126 U/L   Total Bilirubin 0.7 0.3 - 1.2 mg/dL   GFR calc non Af Amer >60 >60 mL/min   GFR calc Af Amer >60 >60 mL/min    Comment: (NOTE) The eGFR has been calculated  using the CKD EPI equation. This calculation has not been validated in all clinical situations. eGFR's persistently <60 mL/min signify possible Chronic Kidney Disease.    Anion gap 9 5 - 15    Comment: Performed at [facility], [address] [place], [place]  [address]  CBC     Status: None   Collection Time: 05/14/18  6:41 PM  Result Value Ref Range   WBC 9.2 4.0 - 10.5 K/uL   RBC 4.08 3.87 - 5.11 MIL/uL   Hemoglobin 12.4 12.0 - 15.0 g/dL   HCT 38.7 36.0 - 46.0 %   MCV 94.9 80.0 - 100.0 fL   MCH 30.4 26.0 - 34.0 pg   MCHC 32.0 30.0 - 36.0 g/dL   RDW 14.2 11.5 - 15.5 %   Platelets 344 150 - 400 K/uL   nRBC 0.0 0.0 - 0.2 %    Comment: Performed at [facility], [address], [place], [place] [address]  Urinalysis, Routine w reflex microscopic     Status: Abnormal   Collection Time: 05/14/18  9:30 PM  Result Value Ref Range   Color, Urine YELLOW YELLOW   APPearance HAZY (A) CLEAR   Specific Gravity, Urine 1.024 1.005 - 1.030   pH 5.0 5.0 - 8.0   Glucose, UA NEGATIVE NEGATIVE mg/dL   Hgb urine dipstick NEGATIVE NEGATIVE   Bilirubin Urine NEGATIVE NEGATIVE   Ketones, ur NEGATIVE NEGATIVE mg/dL   Protein, ur NEGATIVE NEGATIVE mg/dL   Nitrite NEGATIVE NEGATIVE   Leukocytes, UA LARGE (A) NEGATIVE   RBC / HPF 0-5 0 - 5 RBC/hpf   WBC, UA 21-50 0 - 5 WBC/hpf   Bacteria, UA NONE SEEN NONE SEEN   Squamous Epithelial / LPF 0-5 0 - 5   Mucus PRESENT    Hyaline Casts, UA PRESENT     Comment: Performed at [facility], [address], [place], [place] [address]    Personally reviewed Ct scan- gallbladder distended with stones, thickening of the wall and some fluid  Ct Abdomen Pelvis W Contrast  Result Date: 05/14/2018 CLINICAL DATA:  62 year old female with upper abdominal pain. EXAM: CT ABDOMEN AND PELVIS WITH CONTRAST TECHNIQUE: Multidetector CT imaging of the abdomen and pelvis was performed using the standard protocol following bolus administration of intravenous contrast.  CONTRAST:  1107m ISOVUE-300 IOPAMIDOL (ISOVUE-300) INJECTION 61% COMPARISON:  None. FINDINGS: Lower chest: The visualized lung bases are clear. No intra-abdominal free air or free fluid. Hepatobiliary: The liver is unremarkable. There are multiple stones within the gallbladder including gallbladder neck. There is mild distention of the gallbladder with apparent mild thickening of the gallbladder wall and trace pericholecystic fluid. Ultrasound is recommended for better evaluation of the gallbladder. Pancreas: Unremarkable. No pancreatic ductal dilatation or surrounding inflammatory changes. Spleen: Normal in size without focal abnormality. Adrenals/Urinary Tract: The adrenal glands are unremarkable. The kidneys, visualized ureters, and urinary bladder appear unremarkable as well. Stomach/Bowel: There is sigmoid diverticulosis without active inflammatory changes. No bowel obstruction or active inflammation. Normal appendix. Vascular/Lymphatic: No significant vascular findings are present. No enlarged abdominal or pelvic lymph nodes. Reproductive: The uterus and ovaries are grossly unremarkable as visualized. No pelvic mass. Other: Small fat containing umbilical hernia. Musculoskeletal: There is degenerative changes of the spine. Age indeterminate, likely chronic L1 compression fracture with anterior wedging and resulting mild focal kyphosis of the lumbar spine. Chronic appearing compression deformity of the superior endplate of L5. There is grade 1 L4-L5 anterolisthesis. IMPRESSION: 1. Cholelithiasis with findings concerning for an early acute cholecystitis. Ultrasound is recommended for better evaluation of the gallbladder. 2. Sigmoid diverticulosis. No bowel obstruction or active inflammation. Normal appendix. Electronically Signed   By: AAnner CreteM.D.   On: 05/14/2018 22:34     Results for RPRIMA, RAYNER(MRN [id number]  as of 05/16/2018 11:31  Ref. Range 05/14/2018 18:41  Sodium Latest Ref Range: 135 -  145 mmol/L 140  Potassium Latest Ref Range: 3.5 - 5.1 mmol/L 3.6  Chloride Latest Ref Range: 98 - 111 mmol/L 109  CO2 Latest Ref Range: 22 - 32 mmol/L 22  Glucose Latest Ref Range: 70 - 99 mg/dL 157 (H)  BUN Latest Ref Range: 8 - 23 mg/dL 16  Creatinine Latest Ref Range: 0.44 - 1.00 mg/dL 0.91  Calcium Latest Ref Range: 8.9 - 10.3 mg/dL 9.7  Anion gap Latest Ref Range: 5 - 15  9  Alkaline Phosphatase Latest Ref Range: 38 - 126 U/L 79  Albumin Latest Ref Range: 3.5 - 5.0 g/dL 4.2  Lipase Latest Ref Range: 11 - 51 U/L 33  AST Latest Ref Range: 15 - 41 U/L 23  ALT Latest Ref Range: 0 - 44 U/L 21  Total Protein Latest Ref Range: 6.5 - 8.1 g/dL 7.7  Total Bilirubin Latest Ref Range: 0.3 - 1.2 mg/dL 0.7  GFR, Est Non African American Latest Ref Range: >60 mL/min >60  GFR, Est African American Latest Ref Range: >60 mL/min >60   Results for TRISTY, UDOVICH (MRN [id number]) as of 05/16/2018 11:31  Ref. Range 05/14/2018 18:41  WBC Latest Ref Range: 4.0 - 10.5 K/uL 9.2  RBC Latest Ref Range: 3.87 - 5.11 MIL/uL 4.08  Hemoglobin Latest Ref Range: 12.0 - 15.0 g/dL 12.4  HCT Latest Ref Range: 36.0 - 46.0 % 38.7  MCV Latest Ref Range: 80.0 - 100.0 fL 94.9  MCH Latest Ref Range: 26.0 - 34.0 pg 30.4  MCHC Latest Ref Range: 30.0 - 36.0 g/dL 32.0  RDW Latest Ref Range: 11.5 - 15.5 % 14.2  Platelets Latest Ref Range: 150 - 400 K/uL 344  nRBC Latest Ref Range: 0.0 - 0.2 % 0.0    Assessment & Plan:  Shawna Williams is a 62 y.o. female with cholelithiasis/ biliary colic and potentially cholecystitis based on the CT scan, LFTs wnl 2 days ago. She continues to have abdominal pain, nausea/vomiting. Given this we will proceed with surgery as soon as possible. She has a history of RA but has been off her methotrexate for over 2 weeks. She has the recent diagnosis of Hepatitis C and will be seeing GI. She was diagnosed with this after screening given her RA medications and as above her LFTs are normal.   -Lap  cholecystectomy possible open   PLAN: I counseled the patient about the indication, risks and benefits of laparoscopic cholecystectomy.  She understands there is a very small chance for bleeding, infection, injury to normal structures (including common bile duct), conversion to open surgery, persistent symptoms, evolution of postcholecystectomy diarrhea, need for secondary interventions, anesthesia reaction, cardiopulmonary issues and other risks not specifically detailed here. I described the expected recovery, the plan for follow-up and the restrictions during the recovery phase.  All questions were answered.    Virl Cagey 05/16/2018, 12:50 PM

## 2018-05-16 NOTE — Patient Instructions (Signed)
Cholelithiasis Cholelithiasis is a form of gallbladder disease in which gallstones form in the gallbladder. The gallbladder is an organ that stores bile. Bile is made in the liver, and it helps to digest fats. Gallstones begin as small crystals and slowly grow into stones. They may cause no symptoms until the gallbladder tightens (contracts) and a gallstone is blocking the duct (gallbladder attack), which can cause pain. Cholelithiasis is also referred to as gallstones. There are two main types of gallstones:  Cholesterol stones. These are made of hardened cholesterol and are usually yellow-green in color. They are the most common type of gallstone. Cholesterol is a white, waxy, fat-like substance that is made in the liver.  Pigment stones. These are dark in color and are made of a red-yellow substance that forms when hemoglobin from red blood cells breaks down (bilirubin).  What are the causes? This condition may be caused by an imbalance in the substances that bile is made of. This can happen if the bile:  Has too much bilirubin.  Has too much cholesterol.  Does not have enough bile salts. These salts help the body absorb and digest fats.  In some cases, this condition can also be caused by the gallbladder not emptying completely or often enough. What increases the risk? The following factors may make you more likely to develop this condition:  Being female.  Having multiple pregnancies. Health care providers sometimes advise removing diseased gallbladders before future pregnancies.  Eating a diet that is heavy in fried foods, fat, and refined carbohydrates, like white bread and white rice.  Being obese.  Being older than age 40.  Prolonged use of medicines that contain female hormones (estrogen).  Having diabetes mellitus.  Rapidly losing weight.  Having a family history of gallstones.  Being of American Indian or Mexican descent.  Having an intestinal disease such as  Crohn disease.  Having metabolic syndrome.  Having cirrhosis.  Having severe types of anemia such as sickle cell anemia.  What are the signs or symptoms? In most cases, there are no symptoms. These are known as silent gallstones. If a gallstone blocks the bile ducts, it can cause a gallbladder attack. The main symptom of a gallbladder attack is sudden pain in the upper right abdomen. The pain usually comes at night or after eating a large meal. The pain can last for one or several hours and can spread to the right shoulder or chest. If the bile duct is blocked for more than a few hours, it can cause infection or inflammation of the gallbladder, liver, or pancreas, which may cause:  Nausea.  Vomiting.  Abdominal pain that lasts for 5 hours or more.  Fever or chills.  Yellowing of the skin or the whites of the eyes (jaundice).  Dark urine.  Light-colored stools.  How is this diagnosed? This condition may be diagnosed based on:  A physical exam.  Your medical history.  An ultrasound of your gallbladder.  CT scan.  MRI.  Blood tests to check for signs of infection or inflammation.  A scan of your gallbladder and bile ducts (biliary system) using nonharmful radioactive material and special cameras that can see the radioactive material (cholescintigram). This test checks to see how your gallbladder contracts and whether bile ducts are blocked.  Inserting a small tube with a camera on the end (endoscope) through your mouth to inspect bile ducts and check for blockages (endoscopic retrograde cholangiopancreatogram).  How is this treated? Treatment for gallstones depends on the   severity of the condition. Silent gallstones do not need treatment. If the gallstones cause a gallbladder attack or other symptoms, treatment may be required. Options for treatment include:  Surgery to remove the gallbladder (cholecystectomy). This is the most common treatment.  Medicines to dissolve  gallstones. These are most effective at treating small gallstones. You may need to take medicines for up to 6-12 months.  Shock wave treatment (extracorporeal biliary lithotripsy). In this treatment, an ultrasound machine sends shock waves to the gallbladder to break gallstones into smaller pieces. These pieces can then be passed into the intestines or be dissolved by medicine. This is rarely used.  Removing gallstones through endoscopic retrograde cholangiopancreatogram. A small basket can be attached to the endoscope and used to capture and remove gallstones.  Follow these instructions at home:  Take over-the-counter and prescription medicines only as told by your health care provider.  Maintain a healthy weight and follow a healthy diet. This includes: ? Reducing fatty foods, such as fried food. ? Reducing refined carbohydrates, like white bread and white rice. ? Increasing fiber. Aim for foods like almonds, fruit, and beans.  Keep all follow-up visits as told by your health care provider. This is important. Contact a health care provider if:  You think you have had a gallbladder attack.  You have been diagnosed with silent gallstones and you develop abdominal pain or indigestion. Get help right away if:  You have pain from a gallbladder attack that lasts for more than 2 hours.  You have abdominal pain that lasts for more than 5 hours.  You have a fever or chills.  You have persistent nausea and vomiting.  You develop jaundice.  You have dark urine or light-colored stools. Summary  Cholelithiasis (also called gallstones) is a form of gallbladder disease in which gallstones form in the gallbladder.  This condition is caused by an imbalance in the substances that make up bile. This can happen if the bile has too much cholesterol, too much bilirubin, or not enough bile salts.  You are more likely to develop this condition if you are female, pregnant, using medicines with  estrogen, obese, older than age 40, or have a family history of gallstones. You may also develop gallstones if you have diabetes, an intestinal disease, cirrhosis, or metabolic syndrome.  Treatment for gallstones depends on the severity of the condition. Silent gallstones do not need treatment.  If gallstones cause a gallbladder attack or other symptoms, treatment may be needed. The most common treatment is surgery to remove the gallbladder. This information is not intended to replace advice given to you by your health care provider. Make sure you discuss any questions you have with your health care provider. Document Released: 07/01/2005 Document Revised: 03/21/2016 Document Reviewed: 03/21/2016 Elsevier Interactive Patient Education  2018 [employer]  Laparoscopic Cholecystectomy Laparoscopic cholecystectomy is surgery to remove the gallbladder. The gallbladder is a pear-shaped organ that lies beneath the liver on the right side of the body. The gallbladder stores bile, which is a fluid that helps the body to digest fats. Cholecystectomy is often done for inflammation of the gallbladder (cholecystitis). This condition is usually caused by a buildup of gallstones (cholelithiasis) in the gallbladder. Gallstones can block the flow of bile, which can result in inflammation and pain. In severe cases, emergency surgery may be required. This procedure is done though small incisions in your abdomen (laparoscopic surgery). A thin scope with a camera (laparoscope) is inserted through one incision. Thin surgical instruments are   inserted through the other incisions. In some cases, a laparoscopic procedure may be turned into a type of surgery that is done through a larger incision (open surgery). Tell a health care provider about:  Any allergies you have.  All medicines you are taking, including vitamins, herbs, eye drops, creams, and over-the-counter medicines.  Any problems you or family members have had  with anesthetic medicines.  Any blood disorders you have.  Any surgeries you have had.  Any medical conditions you have.  Whether you are pregnant or may be pregnant. What are the risks? Generally, this is a safe procedure. However, problems may occur, including:  Infection.  Bleeding.  Allergic reactions to medicines.  Damage to other structures or organs.  A stone remaining in the common bile duct. The common bile duct carries bile from the gallbladder into the small intestine.  A bile leak from the cyst duct that is clipped when your gallbladder is removed.  Medicines  Ask your health care provider about: ? Changing or stopping your regular medicines. This is especially important if you are taking diabetes medicines or blood thinners. ? Taking medicines such as aspirin and ibuprofen. These medicines can thin your blood. Do not take these medicines before your procedure if your health care provider instructs you not to.  You may be given antibiotic medicine to help prevent infection. General instructions  Let your health care provider know if you develop a cold or an infection before surgery.  Plan to have someone take you home from the hospital or clinic.  Ask your health care provider how your surgical site will be marked or identified. What happens during the procedure?  To reduce your risk of infection: ? Your health care team will wash or sanitize their hands. ? Your skin will be washed with soap. ? Hair may be removed from the surgical area.  An IV tube may be inserted into one of your veins.  You will be given one or more of the following: ? A medicine to help you relax (sedative). ? A medicine to make you fall asleep (general anesthetic).  A breathing tube will be placed in your mouth.  Your surgeon will make several small cuts (incisions) in your abdomen.  The laparoscope will be inserted through one of the small incisions. The camera on the  laparoscope will send images to a TV screen (monitor) in the operating room. This lets your surgeon see inside your abdomen.  Air-like gas will be pumped into your abdomen. This will expand your abdomen to give the surgeon more room to perform the surgery.  Other tools that are needed for the procedure will be inserted through the other incisions. The gallbladder will be removed through one of the incisions.  Your common bile duct may be examined. If stones are found in the common bile duct, they may be removed.  After your gallbladder has been removed, the incisions will be closed with stitches (sutures), staples, or skin glue.  Your incisions may be covered with a bandage (dressing). The procedure may vary among health care providers and hospitals. What happens after the procedure?  Your blood pressure, heart rate, breathing rate, and blood oxygen level will be monitored until the medicines you were given have worn off.  You will be given medicines as needed to control your pain.  Do not drive for 24 hours if you were given a sedative. This information is not intended to replace advice given to you by your health   care provider. Make sure you discuss any questions you have with your health care provider. Document Released: 07/05/2005 Document Revised: 01/25/2016 Document Reviewed: 12/22/2015 Elsevier Interactive Patient Education  2018 [employer] Inc.  

## 2018-05-17 ENCOUNTER — Encounter (HOSPITAL_COMMUNITY)
Admission: RE | Admit: 2018-05-17 | Discharge: 2018-05-17 | Disposition: A | Discharge status: Home / Self Care | Payer: BC Managed Care – PPO | Source: Ambulatory Visit | Attending: General Surgery | Admitting: General Surgery

## 2018-05-17 ENCOUNTER — Encounter (HOSPITAL_COMMUNITY): Payer: Self-pay

## 2018-05-18 ENCOUNTER — Encounter (HOSPITAL_COMMUNITY): Payer: Self-pay | Admitting: *Deleted

## 2018-05-18 ENCOUNTER — Ambulatory Visit (HOSPITAL_COMMUNITY): Payer: BC Managed Care – PPO | Admitting: Anesthesiology

## 2018-05-18 ENCOUNTER — Other Ambulatory Visit: Payer: Self-pay

## 2018-05-18 ENCOUNTER — Ambulatory Visit (INDEPENDENT_AMBULATORY_CARE_PROVIDER_SITE_OTHER): Payer: BC Managed Care – PPO | Admitting: Internal Medicine

## 2018-05-18 ENCOUNTER — Ambulatory Visit (HOSPITAL_COMMUNITY)
Admission: RE | Admit: 2018-05-18 | Discharge: 2018-05-18 | Disposition: A | Discharge status: Home / Self Care | Payer: BC Managed Care – PPO | Source: Ambulatory Visit | Attending: General Surgery | Admitting: General Surgery

## 2018-05-18 ENCOUNTER — Encounter (HOSPITAL_COMMUNITY)
Admission: RE | Disposition: A | Discharge status: Home / Self Care | Payer: Self-pay | Source: Ambulatory Visit | Attending: General Surgery

## 2018-05-18 DIAGNOSIS — Z833 Family history of diabetes mellitus: Secondary | ICD-10-CM | POA: Diagnosis not present

## 2018-05-18 DIAGNOSIS — K828 Other specified diseases of gallbladder: Secondary | ICD-10-CM | POA: Diagnosis not present

## 2018-05-18 DIAGNOSIS — F329 Major depressive disorder, single episode, unspecified: Secondary | ICD-10-CM | POA: Insufficient documentation

## 2018-05-18 DIAGNOSIS — K219 Gastro-esophageal reflux disease without esophagitis: Secondary | ICD-10-CM | POA: Insufficient documentation

## 2018-05-18 DIAGNOSIS — E119 Type 2 diabetes mellitus without complications: Secondary | ICD-10-CM | POA: Diagnosis not present

## 2018-05-18 DIAGNOSIS — Z823 Family history of stroke: Secondary | ICD-10-CM | POA: Diagnosis not present

## 2018-05-18 DIAGNOSIS — J45909 Unspecified asthma, uncomplicated: Secondary | ICD-10-CM | POA: Insufficient documentation

## 2018-05-18 DIAGNOSIS — Z79899 Other long term (current) drug therapy: Secondary | ICD-10-CM | POA: Insufficient documentation

## 2018-05-18 DIAGNOSIS — Z888 Allergy status to other drugs, medicaments and biological substances status: Secondary | ICD-10-CM | POA: Diagnosis not present

## 2018-05-18 DIAGNOSIS — K8000 Calculus of gallbladder with acute cholecystitis without obstruction: Secondary | ICD-10-CM | POA: Diagnosis not present

## 2018-05-18 DIAGNOSIS — B1920 Unspecified viral hepatitis C without hepatic coma: Secondary | ICD-10-CM | POA: Insufficient documentation

## 2018-05-18 DIAGNOSIS — M069 Rheumatoid arthritis, unspecified: Secondary | ICD-10-CM | POA: Insufficient documentation

## 2018-05-18 DIAGNOSIS — I10 Essential (primary) hypertension: Secondary | ICD-10-CM | POA: Diagnosis not present

## 2018-05-18 DIAGNOSIS — E785 Hyperlipidemia, unspecified: Secondary | ICD-10-CM | POA: Diagnosis not present

## 2018-05-18 DIAGNOSIS — K429 Umbilical hernia without obstruction or gangrene: Secondary | ICD-10-CM | POA: Diagnosis not present

## 2018-05-18 DIAGNOSIS — M1990 Unspecified osteoarthritis, unspecified site: Secondary | ICD-10-CM | POA: Diagnosis not present

## 2018-05-18 DIAGNOSIS — Z8249 Family history of ischemic heart disease and other diseases of the circulatory system: Secondary | ICD-10-CM | POA: Insufficient documentation

## 2018-05-18 DIAGNOSIS — K810 Acute cholecystitis: Secondary | ICD-10-CM | POA: Diagnosis not present

## 2018-05-18 DIAGNOSIS — Z885 Allergy status to narcotic agent status: Secondary | ICD-10-CM | POA: Insufficient documentation

## 2018-05-18 DIAGNOSIS — K8010 Calculus of gallbladder with chronic cholecystitis without obstruction: Secondary | ICD-10-CM | POA: Diagnosis present

## 2018-05-18 DIAGNOSIS — K5730 Diverticulosis of large intestine without perforation or abscess without bleeding: Secondary | ICD-10-CM | POA: Insufficient documentation

## 2018-05-18 DIAGNOSIS — K8020 Calculus of gallbladder without cholecystitis without obstruction: Secondary | ICD-10-CM

## 2018-05-18 HISTORY — DX: Unspecified asthma, uncomplicated: J45.909

## 2018-05-18 HISTORY — PX: CHOLECYSTECTOMY: SHX55

## 2018-05-18 SURGERY — LAPAROSCOPIC CHOLECYSTECTOMY
Anesthesia: General

## 2018-05-18 MED ORDER — EPHEDRINE SULFATE 50 MG/ML IJ SOLN
INTRAMUSCULAR | Status: AC
  Filled 2018-05-18: qty 1

## 2018-05-18 MED ORDER — SODIUM CHLORIDE 0.9 % IJ SOLN
INTRAMUSCULAR | Status: AC
  Filled 2018-05-18: qty 10

## 2018-05-18 MED ORDER — SUCCINYLCHOLINE CHLORIDE 20 MG/ML IJ SOLN
INTRAMUSCULAR | Status: AC
  Filled 2018-05-18: qty 1

## 2018-05-18 MED ORDER — SUGAMMADEX SODIUM 200 MG/2ML IV SOLN
INTRAVENOUS | Status: DC | PRN
  Administered 2018-05-18: 200 mg via INTRAVENOUS

## 2018-05-18 MED ORDER — BUPIVACAINE HCL (PF) 0.5 % IJ SOLN
INTRAMUSCULAR | Status: DC | PRN
  Administered 2018-05-18: 10 mL

## 2018-05-18 MED ORDER — BUPIVACAINE LIPOSOME 1.3 % IJ SUSP
INTRAMUSCULAR | Status: AC
  Filled 2018-05-18: qty 20

## 2018-05-18 MED ORDER — LACTATED RINGERS IV SOLN
INTRAVENOUS | Status: DC | PRN
  Administered 2018-05-18 (×2): via INTRAVENOUS

## 2018-05-18 MED ORDER — OXYCODONE HCL 5 MG PO TABS: 5.0000 mg | ORAL_TABLET | ORAL | 0 refills | Status: DC | PRN

## 2018-05-18 MED ORDER — SUCCINYLCHOLINE CHLORIDE 20 MG/ML IJ SOLN
INTRAMUSCULAR | Status: DC | PRN
  Administered 2018-05-18: 140 mg via INTRAVENOUS

## 2018-05-18 MED ORDER — SODIUM CHLORIDE 0.9 % IR SOLN
Status: DC | PRN
  Administered 2018-05-18: 1000 mL

## 2018-05-18 MED ORDER — MIDAZOLAM HCL 5 MG/5ML IJ SOLN
INTRAMUSCULAR | Status: DC | PRN
  Administered 2018-05-18: 2 mg via INTRAVENOUS

## 2018-05-18 MED ORDER — LIDOCAINE HCL (PF) 1 % IJ SOLN
INTRAMUSCULAR | Status: AC
  Filled 2018-05-18: qty 5

## 2018-05-18 MED ORDER — ROCURONIUM BROMIDE 100 MG/10ML IV SOLN
INTRAVENOUS | Status: DC | PRN
  Administered 2018-05-18: 5 mg via INTRAVENOUS
  Administered 2018-05-18: 35 mg via INTRAVENOUS

## 2018-05-18 MED ORDER — MIDAZOLAM HCL 2 MG/2ML IJ SOLN
INTRAMUSCULAR | Status: AC
  Filled 2018-05-18: qty 2

## 2018-05-18 MED ORDER — PROPOFOL 10 MG/ML IV BOLUS
INTRAVENOUS | Status: DC | PRN
  Administered 2018-05-18: 20 mg via INTRAVENOUS
  Administered 2018-05-18: 150 mg via INTRAVENOUS

## 2018-05-18 MED ORDER — PROPOFOL 10 MG/ML IV BOLUS
INTRAVENOUS | Status: AC
  Filled 2018-05-18: qty 20

## 2018-05-18 MED ORDER — MIDAZOLAM HCL 2 MG/2ML IJ SOLN: 0.5000 mg | Freq: Once | INTRAMUSCULAR | Status: DC | PRN

## 2018-05-18 MED ORDER — SODIUM CHLORIDE 0.9 % IV SOLN
INTRAVENOUS | Status: AC
  Filled 2018-05-18: qty 2

## 2018-05-18 MED ORDER — HEMOSTATIC AGENTS (NO CHARGE) OPTIME
TOPICAL | Status: DC | PRN
  Administered 2018-05-18 (×2): 1 via TOPICAL

## 2018-05-18 MED ORDER — ONDANSETRON HCL 4 MG/2ML IJ SOLN
INTRAMUSCULAR | Status: DC | PRN
  Administered 2018-05-18: 4 mg via INTRAVENOUS

## 2018-05-18 MED ORDER — ROCURONIUM BROMIDE 50 MG/5ML IV SOLN
INTRAVENOUS | Status: AC
  Filled 2018-05-18: qty 1

## 2018-05-18 MED ORDER — LACTATED RINGERS IV SOLN: INTRAVENOUS | Status: DC

## 2018-05-18 MED ORDER — BUPIVACAINE HCL (PF) 0.5 % IJ SOLN
INTRAMUSCULAR | Status: AC
  Filled 2018-05-18: qty 30

## 2018-05-18 MED ORDER — LABETALOL HCL 5 MG/ML IV SOLN
INTRAVENOUS | Status: AC
  Filled 2018-05-18: qty 4

## 2018-05-18 MED ORDER — SUGAMMADEX SODIUM 200 MG/2ML IV SOLN
INTRAVENOUS | Status: AC
  Filled 2018-05-18: qty 2

## 2018-05-18 MED ORDER — FENTANYL CITRATE (PF) 250 MCG/5ML IJ SOLN
INTRAMUSCULAR | Status: AC
  Filled 2018-05-18: qty 5

## 2018-05-18 MED ORDER — CHLORHEXIDINE GLUCONATE CLOTH 2 % EX PADS: 6.0000 | MEDICATED_PAD | Freq: Once | CUTANEOUS | Status: DC

## 2018-05-18 MED ORDER — LIDOCAINE HCL 1 % IJ SOLN
INTRAMUSCULAR | Status: DC | PRN
  Administered 2018-05-18: 40 mg via INTRADERMAL

## 2018-05-18 MED ORDER — FENTANYL CITRATE (PF) 100 MCG/2ML IJ SOLN
INTRAMUSCULAR | Status: DC | PRN
  Administered 2018-05-18: 50 ug via INTRAVENOUS
  Administered 2018-05-18 (×2): 100 ug via INTRAVENOUS

## 2018-05-18 MED ORDER — SODIUM CHLORIDE 0.9 % IV SOLN
2.0000 g | INTRAVENOUS | Status: AC
  Administered 2018-05-18: 2 g via INTRAVENOUS

## 2018-05-18 MED ORDER — PROMETHAZINE HCL 25 MG/ML IJ SOLN: 6.2500 mg | INTRAMUSCULAR | Status: DC | PRN

## 2018-05-18 MED ORDER — ONDANSETRON HCL 4 MG/2ML IJ SOLN
INTRAMUSCULAR | Status: AC
  Filled 2018-05-18: qty 2

## 2018-05-18 MED ORDER — LABETALOL HCL 5 MG/ML IV SOLN
INTRAVENOUS | Status: DC | PRN
  Administered 2018-05-18 (×2): 5 mg via INTRAVENOUS

## 2018-05-18 SURGICAL SUPPLY — 46 items
APPLICATOR ARISTA FLEXITIP XL (MISCELLANEOUS) ×2 IMPLANT
APPLIER CLIP ROT 10 11.4 M/L (STAPLE) ×2
BAG RETRIEVAL 10 (BASKET) ×1
BLADE SURG 15 STRL LF DISP TIS (BLADE) ×1 IMPLANT
BLADE SURG 15 STRL SS (BLADE) ×1
CHLORAPREP W/TINT 26ML (MISCELLANEOUS) ×2 IMPLANT
CLIP APPLIE ROT 10 11.4 M/L (STAPLE) ×1 IMPLANT
CLOTH BEACON ORANGE TIMEOUT ST (SAFETY) ×2 IMPLANT
COVER LIGHT HANDLE STERIS (MISCELLANEOUS) ×4 IMPLANT
DECANTER SPIKE VIAL GLASS SM (MISCELLANEOUS) ×2 IMPLANT
DERMABOND ADVANCED (GAUZE/BANDAGES/DRESSINGS) ×1
DERMABOND ADVANCED .7 DNX12 (GAUZE/BANDAGES/DRESSINGS) ×1 IMPLANT
ELECT REM PT RETURN 9FT ADLT (ELECTROSURGICAL) ×2
ELECTRODE REM PT RTRN 9FT ADLT (ELECTROSURGICAL) ×1 IMPLANT
FILTER SMOKE EVAC LAPAROSHD (FILTER) ×2 IMPLANT
GAUZE SPONGE 4X4 12PLY STRL (GAUZE/BANDAGES/DRESSINGS) ×8 IMPLANT
GLOVE BIO SURGEON STRL SZ 6.5 (GLOVE) ×2 IMPLANT
GLOVE BIO SURGEON STRL SZ7 (GLOVE) ×2 IMPLANT
GLOVE BIOGEL PI IND STRL 6.5 (GLOVE) ×1 IMPLANT
GLOVE BIOGEL PI IND STRL 7.0 (GLOVE) ×3 IMPLANT
GLOVE BIOGEL PI INDICATOR 6.5 (GLOVE) ×1
GLOVE BIOGEL PI INDICATOR 7.0 (GLOVE) ×3
GOWN STRL REUS W/TWL LRG LVL3 (GOWN DISPOSABLE) ×6 IMPLANT
HEMOSTAT ARISTA ABSORB 3G PWDR (MISCELLANEOUS) ×2 IMPLANT
HEMOSTAT SNOW SURGICEL 2X4 (HEMOSTASIS) ×2 IMPLANT
INST SET LAPROSCOPIC AP (KITS) ×2 IMPLANT
IV NS IRRIG 3000ML ARTHROMATIC (IV SOLUTION) IMPLANT
KIT TURNOVER KIT A (KITS) ×2 IMPLANT
MANIFOLD NEPTUNE II (INSTRUMENTS) ×2 IMPLANT
NEEDLE INSUFFLATION 14GA 120MM (NEEDLE) ×2 IMPLANT
NS IRRIG 1000ML POUR BTL (IV SOLUTION) ×2 IMPLANT
PACK LAP CHOLE LZT030E (CUSTOM PROCEDURE TRAY) ×2 IMPLANT
PAD ARMBOARD 7.5X6 YLW CONV (MISCELLANEOUS) ×2 IMPLANT
SET BASIN LINEN APH (SET/KITS/TRAYS/PACK) ×2 IMPLANT
SET TUBE IRRIG SUCTION NO TIP (IRRIGATION / IRRIGATOR) IMPLANT
SLEEVE ENDOPATH XCEL 5M (ENDOMECHANICALS) ×2 IMPLANT
SUT MNCRL AB 4-0 PS2 18 (SUTURE) ×2 IMPLANT
SUT VICRYL 0 UR6 27IN ABS (SUTURE) ×2 IMPLANT
SYS BAG RETRIEVAL 10MM (BASKET) ×1
SYSTEM BAG RETRIEVAL 10MM (BASKET) ×1 IMPLANT
TROCAR ENDO BLADELESS 11MM (ENDOMECHANICALS) ×2 IMPLANT
TROCAR XCEL NON-BLD 5MMX100MML (ENDOMECHANICALS) ×2 IMPLANT
TROCAR XCEL UNIV SLVE 11M 100M (ENDOMECHANICALS) ×2 IMPLANT
TUBE CONNECTING 12X1/4 (SUCTIONS) ×2 IMPLANT
TUBING INSUFFLATION (TUBING) ×2 IMPLANT
WARMER LAPAROSCOPE (MISCELLANEOUS) ×2 IMPLANT

## 2018-05-18 NOTE — Anesthesia Postprocedure Evaluation (Signed)
Anesthesia Post Note  Patient: Shawna Williams  Procedure(s) Performed: LAPAROSCOPIC CHOLECYSTECTOMY (N/A )  Patient location during evaluation: PACU Anesthesia Type: General Level of consciousness: awake and patient cooperative Pain management: pain level controlled Vital Signs Assessment: post-procedure vital signs reviewed and stable Respiratory status: spontaneous breathing, nonlabored ventilation and respiratory function stable Cardiovascular status: blood pressure returned to baseline Postop Assessment: no apparent nausea or vomiting Anesthetic complications: no     Last Vitals:  Vitals:   05/18/18 1230 05/18/18 1245  BP: 133/81 (!) (P) 146/86  Pulse: 74   Resp: 13 (P) 16  Temp:    SpO2: 92% (P) 93%    Last Pain:  Vitals:   05/18/18 1245  TempSrc:   PainSc: (P) 5                  Jolisa Intriago J

## 2018-05-18 NOTE — Interval H&P Note (Signed)
History and Physical Interval Note:  05/18/2018 10:15 AM  Shawna Williams  has presented today for surgery, with the diagnosis of cholelithiasis  The various methods of treatment have been discussed with the patient and family. After consideration of risks, benefits and other options for treatment, the patient has consented to  Procedure(s): LAPAROSCOPIC CHOLECYSTECTOMY (N/A) as a surgical intervention .  The patient's history has been reviewed, patient examined, no change in status, stable for surgery.  I have reviewed the patient's chart and labs.  Questions were answered to the patient's satisfaction.    Pain is somewhat improved but continues to be there. No questions.   Lucretia Roers

## 2018-05-18 NOTE — Discharge Instructions (Signed)
Discharge Instructions: Shower per your regular routine. Take tylenol and ibuprofen as needed for pain control, alternating every 4-6 hours.  Take the narcotic for breakthrough pain.  You can take the percocet that the ED prescribed you for pain if needed (do not exceed 4000mg  of tylenol in a single day/ each percocet has 350mg ).  If you need to fill the Roxicodone prescription, you can fill it on 05/19/2018 or after as the percocet prescription was for a 4 day supply (filled 10/28 ending 10/31).  Take colace for constipation related to narcotic pain medication. Do not pick at the dermabond glue on your incision sites.    Laparoscopic Cholecystectomy, Care After This sheet gives you information about how to care for yourself after your procedure. Your doctor may also give you more specific instructions. If you have problems or questions, contact your doctor. Follow these instructions at home: Care for cuts from surgery (incisions)   Follow instructions from your doctor about how to take care of your cuts from surgery. Make sure you: ? Wash your hands with soap and water before you change your bandage (dressing). If you cannot use soap and water, use hand sanitizer. ? Change your bandage as told by your doctor. ? Leave stitches (sutures), skin glue, or skin tape (adhesive) strips in place. They may need to stay in place for 2 weeks or longer. If tape strips get loose and curl up, you may trim the loose edges. Do not remove tape strips completely unless your doctor says it is okay.  Do not take baths, swim, or use a hot tub until your doctor says it is okay. You may shower.  Check your surgical cut area every day for signs of infection. Check for: ? More redness, swelling, or pain. ? More fluid or blood. ? Warmth. ? Pus or a bad smell. Activity  Do not drive or use heavy machinery while taking prescription pain medicine.  Do not lift anything that is heavier than 10 lb (4.5 kg) until  your doctor says it is okay.  Do not play contact sports until your doctor says it is okay.  Do not drive for 24 hours if you were given a medicine to help you relax (sedative).  Rest as needed. Do not return to work or school until your doctor says it is okay. General instructions  Take over-the-counter and prescription medicines only as told by your doctor.  To prevent or treat constipation while you are taking prescription pain medicine, your doctor may recommend that you: ? Drink enough fluid to keep your pee (urine) clear or pale yellow. ? Take over-the-counter or prescription medicines. ? Eat foods that are high in fiber, such as fresh fruits and vegetables, whole grains, and beans. ? Limit foods that are high in fat and processed sugars, such as fried and sweet foods. Contact a doctor if:  You develop a rash.  You have more redness, swelling, or pain around your surgical cuts.  You have more fluid or blood coming from your surgical cuts.  Your surgical cuts feel warm to the touch.  You have pus or a bad smell coming from your surgical cuts.  You have a fever.  One or more of your surgical cuts breaks open. Get help right away if:  You have trouble breathing.  You have chest pain.  You have pain that is getting worse in your shoulders.  You faint or feel dizzy when you stand.  You have very bad pain in  your belly (abdomen).  You are sick to your stomach (nauseous) for more than one day.  You have throwing up (vomiting) that lasts for more than one day.  You have leg pain. This information is not intended to replace advice given to you by your health care provider. Make sure you discuss any questions you have with your health care provider. Document Released: 04/13/2008 Document Revised: 01/24/2016 Document Reviewed: 12/22/2015 Elsevier Interactive Patient Education  2018 [employer].  General Anesthesia, Adult, Care After These instructions provide you  with information about caring for yourself after your procedure. Your health care provider may also give you more specific instructions. Your treatment has been planned according to current medical practices, but problems sometimes occur. Call your health care provider if you have any problems or questions after your procedure. What can I expect after the procedure? After the procedure, it is common to have:  Vomiting.  A sore throat.  Mental slowness.  It is common to feel:  Nauseous.  Cold or shivery.  Sleepy.  Tired.  Sore or achy, even in parts of your body where you did not have surgery.  Follow these instructions at home: For at least 24 hours after the procedure:  Do not: ? Participate in activities where you could fall or become injured. ? Drive. ? Use heavy machinery. ? Drink alcohol. ? Take sleeping pills or medicines that cause drowsiness. ? Make important decisions or sign legal documents. ? Take care of children on your own.  Rest. Eating and drinking  If you vomit, drink water, juice, or soup when you can drink without vomiting.  Drink enough fluid to keep your urine clear or pale yellow.  Make sure you have little or no nausea before eating solid foods.  Follow the diet recommended by your health care provider. General instructions  Have a responsible adult stay with you until you are awake and alert.  Return to your normal activities as told by your health care provider. Ask your health care provider what activities are safe for you.  Take over-the-counter and prescription medicines only as told by your health care provider.  If you smoke, do not smoke without supervision.  Keep all follow-up visits as told by your health care provider. This is important. Contact a health care provider if:  You continue to have nausea or vomiting at home, and medicines are not helpful.  You cannot drink fluids or start eating again.  You cannot urinate  after 8-12 hours.  You develop a skin rash.  You have fever.  You have increasing redness at the site of your procedure. Get help right away if:  You have difficulty breathing.  You have chest pain.  You have unexpected bleeding.  You feel that you are having a life-threatening or urgent problem. This information is not intended to replace advice given to you by your health care provider. Make sure you discuss any questions you have with your health care provider. Document Released: 10/11/2000 Document Revised: 12/08/2015 Document Reviewed: 06/19/2015 Elsevier Interactive Patient Education  [employer].

## 2018-05-18 NOTE — Op Note (Signed)
Operative Note   Preoperative Diagnosis: Symptomatic cholelithiasis   Postoperative Diagnosis: Acute Cholecystitis    Procedure(s) Performed: Laparoscopic cholecystectomy   Surgeon: Lillia Abed C. Henreitta Leber, MD   Assistants: No qualified resident was available   Anesthesia: General endotracheal   Anesthesiologist: Dorena Cookey, MD    Specimens: Gallbladder    Estimated Blood Loss: Minimal    Blood Replacement: None    Complications: None    Operative Findings: Distended, inflamed gallbladder consistent with cholecystitis    Procedure: The patient was taken to the operating room and placed supine. General endotracheal anesthesia was induced. Intravenous antibiotics were administered per protocol. An orogastric tube positioned to decompress the stomach. The abdomen was prepared and draped in the usual sterile fashion.    A supraumbilical incision was made and a Veress technique was utilized to achieve pneumoperitoneum to 15 mmHg with carbon dioxide. A 11 mm optiview port was placed through the supraumbilical region, and a 10 mm 0-degree operative laparoscope was introduced. The area underlying the trocar and Veress needle were inspected and without evidence of injury.  Remaining trocars were placed under direct vision. Two 5 mm ports were placed in the right abdomen, between the anterior axillary and midclavicular line.  A final 11 mm port was placed through the mid-epigastrium, near the falciform ligament.    The gallbladder was thick and distended. It was decompressed to allow for manipulation. The bile contents were suctioned out. The gallbladder fundus was elevated cephalad and the infundibulum was retracted to the patient's right. Given the inflammation, the lateral and medial walls were dissected out to allow for more retraction of the gallbladder. The gallbladder/cystic duct junction was skeletonized. The cystic artery noted in the triangle of Calot and was also skeletonized.   We  then continued liberal medial and lateral dissection until the critical view of safety was achieved.    The cystic duct was triply clipped and divided. The cystic artery were doubly clipped and divided. There was a small branch off the cystic artery going to the cystic duct that was clipped and divided. There was general oozing of the tissue from the inflammation.  The gallbladder was then dissected from the liver bed with electrocautery. The specimen was placed in an Endopouch and was retrieved through the epigastric site.   Final inspection revealed acceptable hemostasis. Arista and Surgicel Jamelle Haring were placed in the gallbladder bed given the oozing of the tissue. Trocars were removed and pneumoperitoneum was released 0 Vicryl fascial suture was used to close the epigastric and umbilical port site was smaller than the tip of my finger. Skin incisions were closed with 4-0 Monocryl subcuticular sutures and Dermabond. The patient was awakened from anesthesia and extubated without complication.    Algis Greenhouse, MD [facility] [address] R[address] [place], [place] [address] [phone] (office)

## 2018-05-18 NOTE — Transfer of Care (Signed)
Immediate Anesthesia Transfer of Care Note  Patient: Shawna Williams  Procedure(s) Performed: LAPAROSCOPIC CHOLECYSTECTOMY (N/A )  Patient Location: PACU  Anesthesia Type:General  Level of Consciousness: drowsy and patient cooperative  Airway & Oxygen Therapy: Patient Spontanous Breathing and Patient connected to nasal cannula oxygen  Post-op Assessment: Report given to RN, Post -op Vital signs reviewed and stable and Patient moving all extremities  Post vital signs: Reviewed and stable  Last Vitals:  Vitals Value Taken Time  BP    Temp    Pulse    Resp    SpO2      Last Pain:  Vitals:   05/18/18 1045  TempSrc: Oral  PainSc:       Patients Stated Pain Goal: 8 (05/18/18 1009)  Complications: No apparent anesthesia complications

## 2018-05-18 NOTE — Anesthesia Procedure Notes (Signed)
Procedure Name: Intubation Date/Time: 05/18/2018 10:59 AM Performed by: Charmaine Downs, CRNA Pre-anesthesia Checklist: Patient identified, Patient being monitored, Timeout performed, Emergency Drugs available and Suction available Patient Re-evaluated:Patient Re-evaluated prior to induction Oxygen Delivery Method: Circle System Utilized Preoxygenation: Pre-oxygenation with 100% oxygen Induction Type: IV induction Ventilation: Mask ventilation without difficulty Laryngoscope Size: Mac and 4 Grade View: Grade I Tube type: Oral Tube size: 7.0 mm Number of attempts: 1 Airway Equipment and Method: stylet Placement Confirmation: ETT inserted through vocal cords under direct vision,  positive ETCO2 and breath sounds checked- equal and bilateral Secured at: 22 cm Tube secured with: Tape Dental Injury: Teeth and Oropharynx as per pre-operative assessment

## 2018-05-18 NOTE — Anesthesia Preprocedure Evaluation (Signed)
Anesthesia Evaluation  Patient identified by MRN, date of birth, ID band Patient awake    Reviewed: Allergy & Precautions, NPO status , Patient's Chart, lab work & pertinent test results  Airway Mallampati: II  TM Distance: >3 FB Neck ROM: Full    Dental no notable dental hx. (+) Teeth Intact   Pulmonary asthma ,  Last inhaler over 3 months ago   Pulmonary exam normal breath sounds clear to auscultation       Cardiovascular Exercise Tolerance: Good hypertension, Pt. on medications negative cardio ROS Normal cardiovascular examI Rhythm:Regular Rate:Normal  Denies any CP/MI/DOE   Neuro/Psych Depression negative neurological ROS  negative psych ROS   GI/Hepatic Neg liver ROS, GERD  ,States off meds for 2 months -denies current Sx Recently Dx'd with Hep C -not yet treated   Endo/Other  negative endocrine ROSdiabetesDiet controlled DM  Renal/GU negative Renal ROS  negative genitourinary   Musculoskeletal  (+) Arthritis , Osteoarthritis,    Abdominal   Peds negative pediatric ROS (+)  Hematology negative hematology ROS (+)   Anesthesia Other Findings   Reproductive/Obstetrics negative OB ROS                             Anesthesia Physical Anesthesia Plan  ASA: II  Anesthesia Plan: General   Post-op Pain Management:    Induction: Intravenous  PONV Risk Score and Plan:   Airway Management Planned: Oral ETT  Additional Equipment:   Intra-op Plan:   Post-operative Plan: Extubation in OR  Informed Consent: I have reviewed the patients History and Physical, chart, labs and discussed the procedure including the risks, benefits and alternatives for the proposed anesthesia with the patient or authorized representative who has indicated his/her understanding and acceptance.   Dental advisory given  Plan Discussed with: CRNA  Anesthesia Plan Comments:         Anesthesia Quick  Evaluation

## 2018-05-19 ENCOUNTER — Encounter (HOSPITAL_COMMUNITY): Payer: Self-pay | Admitting: General Surgery

## 2018-05-19 NOTE — Addendum Note (Signed)
Addendum  created 05/19/18 1127 by Dorena Cookey, MD   Attestation recorded in Intraprocedure, Intraprocedure Attestations filed

## 2018-05-29 ENCOUNTER — Encounter (INDEPENDENT_AMBULATORY_CARE_PROVIDER_SITE_OTHER): Payer: Self-pay | Admitting: Internal Medicine

## 2018-05-29 ENCOUNTER — Ambulatory Visit (INDEPENDENT_AMBULATORY_CARE_PROVIDER_SITE_OTHER): Payer: BC Managed Care – PPO | Admitting: Internal Medicine

## 2018-05-29 ENCOUNTER — Encounter (INDEPENDENT_AMBULATORY_CARE_PROVIDER_SITE_OTHER): Payer: Self-pay | Admitting: *Deleted

## 2018-05-29 VITALS — BP 130/82 | HR 72 | Temp 98.7°F | Ht 66.0 in | Wt 204.3 lb

## 2018-05-29 DIAGNOSIS — B1710 Acute hepatitis C without hepatic coma: Secondary | ICD-10-CM | POA: Diagnosis not present

## 2018-05-29 NOTE — Progress Notes (Addendum)
Subjective:    Patient ID: Shawna Williams, female    DOB: 02/10/1956, 62 y.o.   MRN: [id number]  HPI Referred by Dr. Maple Hudson for Hepatitis C. Never did IV drugs. No blood transfusion. Has one tatoo on her rt foot.  No multiple sex partners.  Hx of RA. Diagnosed in 1994. Her appetite is good. No weight loss. BMs are normal.  No abdominal pain. Drinks very little.    Cholecystectomy 05/18/2018   05/11/2018 H and H 12.5 and 35.9, platelet ct 399, ALP 88, AST 16, ALT16 Hepatitis A and B negative. HepC  Antibody positive. Review of Systems Past Medical History:  Diagnosis Date  . Arthritis    rhemuatoid  . Asthma    history of asthmas with a persistan cough  . Hyperlipemia   . Hypertension   . Major depressive disorder   . 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 Type 2 diabetes mellitus (HCC)    controlled by diet    Past Surgical History:  Procedure Laterality Date  . CESAREAN SECTION  1981 and 1985  . CHOLECYSTECTOMY N/A 05/18/2018   Procedure: LAPAROSCOPIC CHOLECYSTECTOMY;  Surgeon: Lucretia Roers, MD;  Location: [facility] ORS;  Service: General;  Laterality: N/A;  . COLONOSCOPY N/A 01/01/2016   Procedure: COLONOSCOPY;  Surgeon: Malissa Hippo, MD;  Location: [facility] SUITE;  Service: Endoscopy;  Laterality: N/A;  930  . FOOT SURGERY Left    with screws  . TONSILLECTOMY      Allergies  Allergen Reactions  . Ketorolac Shortness Of Breath and Other (See Comments)    Edema  . Diltiazem Other (See Comments)    Fatigue  . Codeine Nausea Only    Current Outpatient Medications on File Prior to Visit  Medication Sig Dispense Refill  . Cinnamon 500 MG capsule Take 1,000 mg by mouth daily.    . diclofenac sodium (VOLTAREN) 1 % GEL Voltaren Gel 3 grams to 3 large joints upto TID 3 TUBES with 3 refills (Patient taking differently: as needed. Voltaren Gel 3 grams to 3 large joints upto TID 3 TUBES with 3 refills) 3 Tube 3  . folic acid (FOLVITE) 1 MG tablet Take 2 tablets (2 mg  total) by mouth daily. (Patient taking differently: Take 1 mg by mouth 2 (two) times daily. ) 180 tablet 3  . HUMIRA PEN 40 MG/0.4ML PNKT Inject 40 mg into the skin every 14 (fourteen) days.    Marland Kitchen losartan-hydrochlorothiazide (HYZAAR) 100-25 MG tablet Take 1 tablet by mouth daily.    . methotrexate 50 MG/2ML injection INJECT 0.8 MLS INTO THE SKIN ONCE A WEEK (Patient taking differently: Inject 20 mg into the skin every Sunday. ) 4 mL 2  . Multiple Vitamin (MULTIVITAMIN) tablet Take 1 tablet by mouth daily.    . Multiple Vitamins-Minerals (ICAPS AREDS 2 PO) Take 1 capsule by mouth 2 (two) times daily.     Marland Kitchen topiramate (TOPAMAX) 25 MG capsule Take 25 mg by mouth daily.    . traMADol (ULTRAM) 50 MG tablet Take 1 tablet (50 mg total) by mouth at bedtime. (Patient taking differently: Take 50 mg by mouth 3 (three) times daily as needed for moderate pain. ) 30 tablet 0  . traZODone (DESYREL) 50 MG tablet TAKE 1 TABLET BY MOUTH AT BEDTIME (Patient taking differently: Take 50 mg by mouth at bedtime as needed for sleep. ) 30 tablet 0  . venlafaxine XR (EFFEXOR-XR) 75 MG 24 hr capsule Take 75 mg by mouth daily.  3   No current facility-administered medications on file prior to visit.         Objective:   Physical Exam  Blood pressure 130/82, pulse 72, temperature 98.7 F (37.1 C), height 5\' 6"  (1.676 m), weight 204 lb 4.8 oz (92.7 kg). Alert and oriented. Skin warm and dry. Oral mucosa is moist.   . Sclera anicteric, conjunctivae is pink. Thyroid not enlarged. No cervical lymphadenopathy. Lungs clear. Heart regular rate and rhythm.  Abdomen is soft. Bowel sounds are positive. No hepatomegaly. No abdominal masses felt. No tenderness.  No edema to lower extremities.           Assessment & Plan:  Hepatitis C. Labs: PT/INR AFP, Hep C quaint, Hep C genotype, Urine drug screen, abdomen with elast. HIV.

## 2018-05-29 NOTE — Patient Instructions (Signed)
Labs and US abdomen with Elast. Further recommendations to follow.

## 2018-06-01 ENCOUNTER — Other Ambulatory Visit (INDEPENDENT_AMBULATORY_CARE_PROVIDER_SITE_OTHER): Payer: Self-pay | Admitting: *Deleted

## 2018-06-01 ENCOUNTER — Encounter: Payer: Self-pay | Admitting: General Surgery

## 2018-06-01 ENCOUNTER — Ambulatory Visit (INDEPENDENT_AMBULATORY_CARE_PROVIDER_SITE_OTHER): Payer: Self-pay | Admitting: General Surgery

## 2018-06-01 ENCOUNTER — Ambulatory Visit (HOSPITAL_COMMUNITY)
Admission: RE | Admit: 2018-06-01 | Discharge: 2018-06-01 | Disposition: A | Discharge status: Home / Self Care | Payer: BC Managed Care – PPO | Source: Ambulatory Visit | Attending: Internal Medicine | Admitting: Internal Medicine

## 2018-06-01 VITALS — BP 125/89 | HR 100 | Temp 97.3°F | Resp 18 | Wt 202.4 lb

## 2018-06-01 DIAGNOSIS — B1710 Acute hepatitis C without hepatic coma: Secondary | ICD-10-CM | POA: Insufficient documentation

## 2018-06-01 DIAGNOSIS — Z9049 Acquired absence of other specified parts of digestive tract: Secondary | ICD-10-CM | POA: Diagnosis not present

## 2018-06-01 DIAGNOSIS — K8010 Calculus of gallbladder with chronic cholecystitis without obstruction: Secondary | ICD-10-CM

## 2018-06-01 DIAGNOSIS — B182 Chronic viral hepatitis C: Secondary | ICD-10-CM

## 2018-06-01 LAB — DRUGS OF ABUSE SCREEN W/O ALC, ROUTINE URINE
AMPHETAMINES (1000 ng/mL SCRN): NEGATIVE
BARBITURATES: NEGATIVE
BENZODIAZEPINES: NEGATIVE
COCAINE METABOLITES: NEGATIVE
MARIJUANA MET (50 ng/mL SCRN): NEGATIVE
METHADONE: NEGATIVE
METHAQUALONE: NEGATIVE
OPIATES: NEGATIVE
PHENCYCLIDINE: NEGATIVE
PROPOXYPHENE: NEGATIVE

## 2018-06-01 LAB — AFP TUMOR MARKER: AFP-Tumor Marker: 1.8 ng/mL

## 2018-06-01 LAB — HEPATITIS C RNA QUANTITATIVE
HCV Quantitative Log: <1.18 Log IU/mL
HCV RNA, PCR, QN: <15 IU/mL

## 2018-06-01 LAB — HIV ANTIBODY (ROUTINE TESTING W REFLEX): HIV 1&2 Ab, 4th Generation: NONREACTIVE

## 2018-06-01 LAB — PROTIME-INR
INR: 1
Prothrombin Time: 10.3 s (ref 9.0–11.5)

## 2018-06-01 LAB — HEPATITIS C GENOTYPE: HCV Genotype: NOT DETECTED

## 2018-06-01 NOTE — Patient Instructions (Signed)
Activity as tolerated. Diet as tolerated.   

## 2018-06-01 NOTE — Progress Notes (Signed)
[facility] Note   HPI:  62 y.o. Female presents to clinic for post-op follow-up evaluation after a laparoscopic cholecystectomy. Patient reports she is doing great. Tolerating diet, no pain.  Review of Systems:  No fevers or chills All other review of systems: otherwise negative   Pathology: Diagnosis Gallbladder - ACUTE CHOLECYSTITIS WITH CHOLELITHIASIS.  Vital Signs:  BP 125/89 (BP Location: Left Arm, Patient Position: Sitting, Cuff Size: Normal)   Pulse 100   Temp (!) 97.3 F (36.3 C) (Temporal)   Resp 18   Wt 202 lb 6.4 oz (91.8 kg)   BMI 32.67 kg/m    Physical Exam:  Physical Exam  Constitutional: She appears well-developed.  HENT:  Head: Normocephalic.  Pulmonary/Chest: Effort normal.  Abdominal: Soft. She exhibits no distension. There is no tenderness.  Port sites healed     Assessment:  62 y.o. yo Female s/p Lap cholecystectomy for cholecystitis. Doing great.  Plan:  - Activity and diet as tolerated   - Follow up PRN   All of the above recommendations were discussed with the patient, and all of patient's questions were answered to her expressed satisfaction.  Algis Greenhouse, MD [facility] [address] [place], [place] [address] [phone] (office)

## 2018-11-06 ENCOUNTER — Other Ambulatory Visit: Payer: Self-pay | Admitting: Physician Assistant

## 2018-11-06 NOTE — Telephone Encounter (Signed)
Pt has not been seen in one year. No labs available. She will need a visit and labs prior to refill.

## 2018-11-17 ENCOUNTER — Other Ambulatory Visit: Payer: Self-pay | Admitting: Physician Assistant

## 2019-08-31 ENCOUNTER — Ambulatory Visit: Payer: BC Managed Care – PPO | Admitting: Cardiovascular Disease

## 2019-09-16 ENCOUNTER — Ambulatory Visit: Payer: BC Managed Care – PPO | Attending: Internal Medicine

## 2019-09-16 DIAGNOSIS — Z23 Encounter for immunization: Secondary | ICD-10-CM | POA: Insufficient documentation

## 2019-09-16 NOTE — Progress Notes (Signed)
   Covid-19 Vaccination Clinic  Name:  Shawna Williams    MRN: [id number] DOB: 05/24/1956  09/16/2019  Shawna Williams was observed post Covid-19 immunization for 15 minutes without incidence. She was provided with Vaccine Information Sheet and instruction to access the V-Safe system.   Shawna Williams was instructed to call 911 with any severe reactions post vaccine: Marland Kitchen Difficulty breathing  . Swelling of your face and throat  . A fast heartbeat  . A bad rash all over your body  . Dizziness and weakness    Immunizations Administered    Name Date Dose VIS Date Route   Moderna COVID-19 Vaccine 09/16/2019 12:39 PM 0.5 mL 06/19/2019 Intramuscular   Manufacturer: Moderna   Lot: [id number]   NDC: 84033-533-17

## 2019-09-18 ENCOUNTER — Encounter: Payer: Self-pay | Admitting: *Deleted

## 2019-09-18 ENCOUNTER — Ambulatory Visit: Payer: BC Managed Care – PPO | Admitting: Cardiovascular Disease

## 2019-09-18 ENCOUNTER — Other Ambulatory Visit: Payer: Self-pay

## 2019-09-18 ENCOUNTER — Encounter: Payer: Self-pay | Admitting: Cardiovascular Disease

## 2019-09-18 VITALS — BP 111/78 | HR 98 | Ht 66.0 in | Wt 213.8 lb

## 2019-09-18 DIAGNOSIS — I2510 Atherosclerotic heart disease of native coronary artery without angina pectoris: Secondary | ICD-10-CM

## 2019-09-18 DIAGNOSIS — R002 Palpitations: Secondary | ICD-10-CM

## 2019-09-18 DIAGNOSIS — R079 Chest pain, unspecified: Secondary | ICD-10-CM

## 2019-09-18 DIAGNOSIS — Z01812 Encounter for preprocedural laboratory examination: Secondary | ICD-10-CM

## 2019-09-18 DIAGNOSIS — I10 Essential (primary) hypertension: Secondary | ICD-10-CM

## 2019-09-18 DIAGNOSIS — I208 Other forms of angina pectoris: Secondary | ICD-10-CM | POA: Diagnosis not present

## 2019-09-18 MED ORDER — METOPROLOL TARTRATE 100 MG PO TABS: 100.0000 mg | ORAL_TABLET | Freq: Once | ORAL | 0 refills | Status: DC

## 2019-09-18 NOTE — Addendum Note (Signed)
Addended by: Lesle Chris on: 09/18/2019 03:02 PM   Modules accepted: Orders

## 2019-09-18 NOTE — Progress Notes (Signed)
SUBJECTIVE: The patient presents for past due follow-up.  She has a history of coronary artery calcifications.  She underwent a normal nuclear stress test in June 2019.  She also has a history of rheumatoid arthritis.  She continues to experience upper left-sided chest pains.  They are not exacerbated with exercise.  When she has a lot of people over and she is cooking for them she does have more chest pains.  She denies chest wall tenderness.  Chest pains are alleviated with anxiolytics.  She denies increased stress levels since her last visit with me.  When walking uphill she become short of breath but attributes this to being overweight.  She has also been having more frequent palpitations.  They have never awoken her from sleep.  Social history: She is a retired Pharmacist, hospital.  Her husband, Merry Proud, is also my patient.   Family history: Her father had an MI at age 29 and CABG at age 42 and died at the age of 20. He was a smoker. He also had interstitial lung disease and was a Armed forces logistics/support/administrative officer. Most of his brothers and sisters were dead by the age of 79. Her mother had a stroke. Her middle sister had an MI at age 38 and was a smoker. She died at 53 of an MI. Her older sister died at the age of 28 of cancer. She was a smoker.   Review of Systems: As per "subjective", otherwise negative.  Allergies  Allergen Reactions  . Ketorolac Shortness Of Breath and Other (See Comments)    Edema  . Diltiazem Other (See Comments)    Fatigue  . Codeine Nausea Only    Current Outpatient Medications  Medication Sig Dispense Refill  . cholecalciferol (VITAMIN D3) 25 MCG (1000 UNIT) tablet Take 1,000 Units by mouth daily.    . Cinnamon 500 MG capsule Take 1,000 mg by mouth daily.    . diclofenac sodium (VOLTAREN) 1 % GEL Voltaren Gel 3 grams to 3 large joints upto TID 3 TUBES with 3 refills (Patient taking differently: as needed. Voltaren Gel 3 grams to 3 large joints upto TID 3 TUBES with 3  refills) 3 Tube 3  . folic acid (FOLVITE) 1 MG tablet Take 2 tablets (2 mg total) by mouth daily. (Patient taking differently: Take 1 mg by mouth 2 (two) times daily. ) 180 tablet 3  . LORazepam (ATIVAN) 0.5 MG tablet Take 0.5 mg by mouth daily as needed.    Marland Kitchen losartan-hydrochlorothiazide (HYZAAR) 100-25 MG tablet Take 1 tablet by mouth daily.    . methotrexate 50 MG/2ML injection INJECT 0.8 MLS INTO THE SKIN ONCE A WEEK (Patient taking differently: Inject 60 mg into the skin every Sunday. ) 4 mL 2  . Multiple Vitamin (MULTIVITAMIN) tablet Take 1 tablet by mouth daily.    . Multiple Vitamins-Minerals (ICAPS AREDS 2 PO) Take 1 capsule by mouth 2 (two) times daily.     Marland Kitchen omeprazole (PRILOSEC) 20 MG capsule Take 20 mg by mouth daily.    Marland Kitchen ORENCIA CLICKJECT 485 MG/ML SOAJ Inject 1 Syringe into the skin once a week.    . topiramate (TOPAMAX) 25 MG capsule Take 25 mg by mouth daily.    . traMADol (ULTRAM) 50 MG tablet Take 1 tablet (50 mg total) by mouth at bedtime. (Patient taking differently: Take 50 mg by mouth every 12 (twelve) hours as needed for moderate pain. ) 30 tablet 0  . traZODone (DESYREL) 50 MG tablet TAKE  1 TABLET BY MOUTH AT BEDTIME (Patient taking differently: Take 50 mg by mouth at bedtime as needed for sleep. ) 30 tablet 0  . venlafaxine XR (EFFEXOR-XR) 75 MG 24 hr capsule Take 75 mg by mouth daily.  3   No current facility-administered medications for this visit.    Past Medical History:  Diagnosis Date  . Arthritis    rhemuatoid  . Asthma    history of asthmas with a persistan cough  . Hyperlipemia   . Hypertension   . Major depressive disorder   . 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 Type 2 diabetes mellitus (HCC)    controlled by diet    Past Surgical History:  Procedure Laterality Date  . CESAREAN SECTION  1981 and 1985  . CHOLECYSTECTOMY N/A 05/18/2018   Procedure: LAPAROSCOPIC CHOLECYSTECTOMY;  Surgeon: Lucretia Roers, MD;  Location: [facility];  Service:  General;  Laterality: N/A;  . COLONOSCOPY N/A 01/01/2016   Procedure: COLONOSCOPY;  Surgeon: Malissa Hippo, MD;  Location: [facility];  Service: Endoscopy;  Laterality: N/A;  930  . FOOT SURGERY Left    with screws  . TONSILLECTOMY      Social History   Socioeconomic History  . Marital status: Married    Spouse name: Not on file  . Number of children: Not on file  . Years of education: Not on file  . Highest education level: Not on file  Occupational History  . Not on file  Tobacco Use  . Smoking status: Never Smoker  . Smokeless tobacco: Never Used  Substance and Sexual Activity  . Alcohol use: Not Currently    Comment: rarely  . Drug use: No  . Sexual activity: Not on file  Other Topics Concern  . Not on file  Social History Narrative  . Not on file   Social Determinants of Health   Financial Resource Strain:   . Difficulty of Paying Living Expenses: Not on file  Food Insecurity:   . Worried About Programme researcher, broadcasting/film/video in the Last Year: Not on file  . Ran Out of Food in the Last Year: Not on file  Transportation Needs:   . Lack of Transportation (Medical): Not on file  . Lack of Transportation (Non-Medical): Not on file  Physical Activity:   . Days of Exercise per Week: Not on file  . Minutes of Exercise per Session: Not on file  Stress:   . Feeling of Stress : Not on file  Social Connections:   . Frequency of Communication with Friends and Family: Not on file  . Frequency of Social Gatherings with Friends and Family: Not on file  . Attends Religious Services: Not on file  . Active Member of Clubs or Organizations: Not on file  . Attends Banker Meetings: Not on file  . Marital Status: Not on file  Intimate Partner Violence:   . Fear of Current or Ex-Partner: Not on file  . Emotionally Abused: Not on file  . Physically Abused: Not on file  . Sexually Abused: Not on file    Garnet Sierras, LPN was present throughout the entirety of the  encounter.  Vitals:   09/18/19 1420  BP: 111/78  Pulse: 90  SpO2: 98%  Weight: 213 lb 12.8 oz (97 kg)  Height: 5\' 6"  (1.676 m)    Wt Readings from Last 3 Encounters:  09/18/19 213 lb 12.8 oz (97 kg)  06/01/18 202 lb 6.4 oz (91.8 kg)  05/29/18  204 lb 4.8 oz (92.7 kg)     PHYSICAL EXAM General: NAD HEENT: Normal. Neck: No JVD, no thyromegaly. Lungs: Clear to auscultation bilaterally with normal respiratory effort. CV: Regular rate and rhythm, normal S1/S2, no S3/S4, no murmur. No pretibial or periankle edema.  No carotid bruit.   Abdomen: Soft, nontender, no distention.  Neurologic: Alert and oriented.  Psych: Normal affect. Skin: Normal. Musculoskeletal: No gross deformities.      Labs: Lab Results  Component Value Date/Time   K 3.6 05/14/2018 06:41 PM   BUN 16 05/14/2018 06:41 PM   BUN 28 (H) 09/03/2016 12:19 PM   CREATININE 0.91 05/14/2018 06:41 PM   CREATININE 0.91 01/18/2018 09:50 AM   ALT 21 05/14/2018 06:41 PM   HGB 12.4 05/14/2018 06:41 PM   HGB 12.2 09/03/2016 12:19 PM     Lipids: No results found for: LDLCALC, LDLDIRECT, CHOL, TRIG, HDL     ASSESSMENT AND PLAN:  1. Coronary artery calcifications and chest pain: Normal nuclear stress test in June 2019 as noted above.  Strong family history of heart disease.  Symptoms appear to be worse with emotional stress which is a component of angina.  She does have a strong family history of heart disease.  I will proceed with coronary CT angiography.  2. Hypertension: Controlled on present therapy which includes losartan and hydrochlorothiazide. No changes  3.  Palpitations: I will continue to monitor symptoms.  If they become more frequent I would consider event monitoring.   Disposition: Follow up 3 months virtual visit   Prentice Docker, M.D., F.A.C.C.

## 2019-09-18 NOTE — Patient Instructions (Signed)
Medication Instructions:  Continue all current medications.  Labwork:  BMET - order given today.  You may do this lab at [facility] or [facility].  Please do 2-3 days prior to CT.  Testing/Procedures:  Your physician has requested that you have cardiac CT. Cardiac computed tomography (CT) is a painless test that uses an x-ray machine to take clear, detailed pictures of your heart. For further information please visit [email or web address] Please follow instruction sheet as given.  Office will contact with results via phone or letter.    Follow-Up: 3 months   Any Other Special Instructions Will Be Listed Below (If Applicable).  If you need a refill on your cardiac medications before your next appointment, please call your pharmacy.

## 2019-10-11 ENCOUNTER — Telehealth: Payer: Self-pay | Admitting: Cardiovascular Disease

## 2019-10-11 NOTE — Telephone Encounter (Signed)
Left detailed message that labs should be done 2-3 days before CT scheduled 4/1

## 2019-10-11 NOTE — Telephone Encounter (Signed)
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  Said that if she does not answer to leave a message

## 2019-10-15 LAB — BASIC METABOLIC PANEL
BUN: 19 mg/dL (ref 7–25)
CO2: 26 mmol/L (ref 20–32)
Calcium: 9.9 mg/dL (ref 8.6–10.4)
Chloride: 104 mmol/L (ref 98–110)
Creat: 0.95 mg/dL (ref 0.50–0.99)
Glucose, Bld: 103 mg/dL (ref 65–139)
Potassium: 3.7 mmol/L (ref 3.5–5.3)
Sodium: 139 mmol/L (ref 135–146)

## 2019-10-17 ENCOUNTER — Telehealth (HOSPITAL_COMMUNITY): Payer: Self-pay | Admitting: Emergency Medicine

## 2019-10-17 ENCOUNTER — Encounter (HOSPITAL_COMMUNITY): Payer: Self-pay

## 2019-10-17 NOTE — Telephone Encounter (Signed)
Attempted to call patient regarding upcoming cardiac CT appointment. °Left message on voicemail with name and callback number °Kathlyne Loud RN Navigator Cardiac Imaging °[facility] °[phone] Office °[phone] Cell ° °

## 2019-10-18 ENCOUNTER — Ambulatory Visit (HOSPITAL_COMMUNITY)
Admission: RE | Admit: 2019-10-18 | Discharge: 2019-10-18 | Disposition: A | Discharge status: Home / Self Care | Payer: BC Managed Care – PPO | Source: Ambulatory Visit | Attending: Cardiovascular Disease | Admitting: Cardiovascular Disease

## 2019-10-18 ENCOUNTER — Other Ambulatory Visit: Payer: Self-pay

## 2019-10-18 DIAGNOSIS — I208 Other forms of angina pectoris: Secondary | ICD-10-CM | POA: Insufficient documentation

## 2019-10-18 DIAGNOSIS — R079 Chest pain, unspecified: Secondary | ICD-10-CM | POA: Insufficient documentation

## 2019-10-18 MED ORDER — IOHEXOL 350 MG/ML SOLN
100.0000 mL | Freq: Once | INTRAVENOUS | Status: AC | PRN
  Administered 2019-10-18: 100 mL via INTRAVENOUS

## 2019-10-18 MED ORDER — NITROGLYCERIN 0.4 MG SL SUBL: 0.8000 mg | SUBLINGUAL_TABLET | Freq: Once | SUBLINGUAL | Status: AC

## 2019-10-18 MED ORDER — NITROGLYCERIN 0.4 MG SL SUBL
SUBLINGUAL_TABLET | SUBLINGUAL | Status: AC
  Administered 2019-10-18: 0.8 mg via SUBLINGUAL
  Filled 2019-10-18: qty 2

## 2019-10-19 ENCOUNTER — Ambulatory Visit (HOSPITAL_COMMUNITY)
Admission: RE | Admit: 2019-10-19 | Discharge: 2019-10-19 | Disposition: A | Discharge status: Home / Self Care | Payer: BC Managed Care – PPO | Source: Ambulatory Visit | Attending: Cardiovascular Disease | Admitting: Cardiovascular Disease

## 2019-10-19 DIAGNOSIS — I208 Other forms of angina pectoris: Secondary | ICD-10-CM

## 2019-10-19 DIAGNOSIS — R079 Chest pain, unspecified: Secondary | ICD-10-CM | POA: Diagnosis present

## 2019-10-20 ENCOUNTER — Ambulatory Visit: Payer: BC Managed Care – PPO | Attending: Internal Medicine

## 2019-10-20 DIAGNOSIS — Z23 Encounter for immunization: Secondary | ICD-10-CM

## 2019-10-20 NOTE — Progress Notes (Signed)
   Covid-19 Vaccination Clinic  Name:  GENEIEVE DUELL    MRN: [id number] DOB: 04-15-1956  10/20/2019  Ms. Freundlich was observed post Covid-19 immunization for 15 minutes without incident. She was provided with Vaccine Information Sheet and instruction to access the V-Safe system.   Ms. Forman was instructed to call 911 with any severe reactions post vaccine: Marland Kitchen Difficulty breathing  . Swelling of face and throat  . A fast heartbeat  . A bad rash all over body  . Dizziness and weakness   Immunizations Administered    Name Date Dose VIS Date Route   Moderna COVID-19 Vaccine 10/20/2019  9:28 AM 0.5 mL 06/19/2019 Intramuscular   Manufacturer: Moderna   Lot: [id number]   NDC: 62229-798-92

## 2019-10-24 ENCOUNTER — Telehealth: Payer: Self-pay | Admitting: *Deleted

## 2019-10-24 NOTE — Telephone Encounter (Signed)
-----   Message from Antoine Poche, MD sent at 10/23/2019  3:10 PM EDT ----- CT looks good, no signs of significant blockages   Dominga Ferry MD

## 2019-10-24 NOTE — Telephone Encounter (Signed)
Pt aware - routed to pcp  

## 2019-12-12 ENCOUNTER — Telehealth (INDEPENDENT_AMBULATORY_CARE_PROVIDER_SITE_OTHER): Payer: BC Managed Care – PPO | Admitting: Cardiovascular Disease

## 2019-12-12 ENCOUNTER — Encounter: Payer: Self-pay | Admitting: Cardiovascular Disease

## 2019-12-12 VITALS — BP 112/74 | HR 79 | Ht 66.0 in | Wt 201.0 lb

## 2019-12-12 DIAGNOSIS — I2583 Coronary atherosclerosis due to lipid rich plaque: Secondary | ICD-10-CM

## 2019-12-12 DIAGNOSIS — R002 Palpitations: Secondary | ICD-10-CM | POA: Diagnosis not present

## 2019-12-12 DIAGNOSIS — I2510 Atherosclerotic heart disease of native coronary artery without angina pectoris: Secondary | ICD-10-CM

## 2019-12-12 DIAGNOSIS — I208 Other forms of angina pectoris: Secondary | ICD-10-CM | POA: Diagnosis not present

## 2019-12-12 DIAGNOSIS — I10 Essential (primary) hypertension: Secondary | ICD-10-CM

## 2019-12-12 NOTE — Progress Notes (Signed)
Virtual Visit via Telephone Note   This visit type was conducted due to national recommendations for restrictions regarding the COVID-19 Pandemic (e.g. social distancing) in an effort to limit this patient's exposure and mitigate transmission in our community.  Due to her co-morbid illnesses, this patient is at least at moderate risk for complications without adequate follow up.  This format is felt to be most appropriate for this patient at this time.  The patient did not have access to video technology/had technical difficulties with video requiring transitioning to audio format only (telephone).  All issues noted in this document were discussed and addressed.  No physical exam could be performed with this format.  Please refer to the patient's chart for her  consent to telehealth for [facility]   The patient was identified using 2 identifiers.  Date:  12/12/2019   ID:  [name], DOB 1956/04/06, MRN [id number]  Patient Location: Home Provider Location: Office  PCP:  Kirstie Peri, MD  Cardiologist:  Prentice Docker, MD  Electrophysiologist:  None   Evaluation Performed:  Follow-Up Visit  Chief Complaint: Cardiac testing follow-up  History of Present Illness:    Shawna Williams is a 64 y.o. female with a history of chest pains and rheumatoid arthritis.  Symptoms are typically alleviated with anxiolytics.  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  She seldom has palpitations.  Overall she is feeling much better.   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  Her husband, Shawna Williams, is also my patient.  Past Medical History:  Diagnosis Date  . Arthritis    rhemuatoid  . Asthma    history of asthmas with a persistan cough  . Hyperlipemia   . Hypertension   . Major depressive disorder   . 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 Type 2 diabetes mellitus (HCC)    controlled by diet   Past Surgical History:   Procedure Laterality Date  . CESAREAN SECTION  1981 and 1985  . CHOLECYSTECTOMY N/A 05/18/2018   Procedure: LAPAROSCOPIC CHOLECYSTECTOMY;  Surgeon: Lucretia Roers, MD;  Location: [facility];  Service: General;  Laterality: N/A;  . COLONOSCOPY N/A 01/01/2016   Procedure: COLONOSCOPY;  Surgeon: Malissa Hippo, MD;  Location: [facility];  Service: Endoscopy;  Laterality: N/A;  930  . FOOT SURGERY Left    with screws  . TONSILLECTOMY       Current Meds  Medication Sig  . cholecalciferol (VITAMIN D3) 25 MCG (1000 UNIT) tablet Take 1,000 Units by mouth daily.  . Cinnamon 500 MG capsule Take 1,000 mg by mouth daily.  . diclofenac sodium (VOLTAREN) 1 % GEL Voltaren Gel 3 grams to 3 large joints upto TID 3 TUBES with 3 refills (Patient taking differently: as needed. Voltaren Gel 3 grams to 3 large joints upto TID 3 TUBES with 3 refills)  . folic acid (FOLVITE) 1 MG tablet Take 1 mg by mouth daily.  Marland Kitchen LORazepam (ATIVAN) 0.5 MG tablet Take 0.5 mg by mouth daily as needed.  Marland Kitchen losartan-hydrochlorothiazide (HYZAAR) 100-25 MG tablet Take 1 tablet by mouth daily.  . methotrexate 50 MG/2ML injection INJECT 0.8 MLS INTO THE SKIN ONCE A WEEK (Patient taking differently: Inject 60 mg into the skin every Sunday. )  . Multiple Vitamin (MULTIVITAMIN) tablet Take 1 tablet by mouth daily.  . Multiple Vitamins-Minerals (ICAPS AREDS 2 PO) Take 1 capsule by mouth 2 (two) times daily.   Marland Kitchen omeprazole (  PRILOSEC) 20 MG capsule Take 20 mg by mouth daily.  Marland Kitchen ORENCIA CLICKJECT 125 MG/ML SOAJ Inject 1 Syringe into the skin once a week.  . topiramate (TOPAMAX) 25 MG capsule Take 25 mg by mouth daily.  . traMADol (ULTRAM) 50 MG tablet Take 1 tablet (50 mg total) by mouth at bedtime. (Patient taking differently: Take 50 mg by mouth every 12 (twelve) hours as needed for moderate pain. )  . traZODone (DESYREL) 50 MG tablet TAKE 1 TABLET BY MOUTH AT BEDTIME (Patient taking differently: Take 50 mg by mouth at bedtime as needed  for sleep. )  . venlafaxine XR (EFFEXOR-XR) 75 MG 24 hr capsule Take 75 mg by mouth daily.     Allergies:   Ketorolac, Diltiazem, and Codeine   Social History   Tobacco Use  . Smoking status: Never Smoker  . Smokeless tobacco: Never Used  Substance Use Topics  . Alcohol use: Not Currently    Comment: rarely  . Drug use: No     Family Hx: The patient's family history includes Cancer in her sister; Diabetes in her brother, mother, and sister; Heart attack in her father and sister; Stroke in her mother.  ROS:   Please see the history of present illness.     All other systems reviewed and are negative.   Prior CV studies:   The following studies were reviewed today:  Coronary CT angiography 10/19/19: Coronary Arteries:  Normal coronary origin.  Right dominance.  Left main: The left main is a large caliber vessel with a normal take off from the left coronary cusp that trifurcates to form a left anterior descending artery, ramus intermedius, and a left circumflex artery. There is no plaque or stenosis.  Left anterior descending artery: The proximal LAD contains a moderate mixed-density plaque (50-69%). The mid and distal LAD segments contain minimal noncalcified plaque (<25%). The first diagonal branch contains moderate calcified plaque (50-69%).  Ramus Intermedius: Patent without plaque or stenosis.  LAD is patent without evidence of plaque or stenosis. The LAD gives off 2 patent diagonal branches.  Left circumflex artery: The LCX is non-dominant and patent with minimal non-calcified plaque (<25%). The LCX gives off 2 patent obtuse marginal branches.  Right coronary artery: The RCA is dominant with normal take off from the right coronary cusp. The proximal and mid RCA segments contain minimal calcified plaque (<25%). The RCA terminates as a PDA and right posterolateral branch without evidence of plaque or stenosis.  Right Atrium: Right atrial size is within  normal limits.  Right Ventricle: The right ventricular cavity is within normal limits.  Left Atrium: Left atrial size is normal in size with no left atrial appendage filling defect.  Left Ventricle: The ventricular cavity size is within normal limits. There are no stigmata of prior infarction. There is no abnormal filling defect.  Pulmonary arteries: Normal in size without proximal filling defect.  Pulmonary veins: Normal pulmonary venous drainage.  Pericardium: Normal thickness with no significant effusion or calcium present.  Cardiac valves: The aortic valve is trileaflet without significant calcification. The mitral valve is normal structure without significant calcification.  Aorta: Normal caliber with no significant disease.  Extra-cardiac findings: See attached radiology report for non-cardiac structures.  IMPRESSION: 1. Coronary calcium score of 365. This was 95th percentile for age and sex matched controls.  2. Normal coronary origin with right dominance.  3. Moderate stenosis (50-69%) in the proximal LAD.  4. Moderate stenosis (50-69%) in the first diagonal.  5. Minimal (<  25%) CAD in the LCX/RCA.  RECOMMENDATIONS: 1. Moderate stenosis. Consider symptom-guided anti-ischemic pharmacotherapy as well as risk factor modification per guideline directed care. Additional analysis with CT FFR will be submitted.   1. Left Main: 0.98; no significant stenosis.  2. Mid LAD: 0.86; no significant stenosis. 3. First Diagonal: 0.81; no significant stenosis. 4. LCX: 0.92; no significant stenosis. 5. RCA: 0.92; no significant stenosis.  IMPRESSION: 1.  CT FFR of mid LAD and D1 are negative.  Labs/Other Tests and Data Reviewed:    EKG:  No ECG reviewed.  Recent Labs: 10/15/2019: BUN 19; Creat 0.95; Potassium 3.7; Sodium 139   Recent Lipid Panel No results found for: CHOL, TRIG, HDL, CHOLHDL, LDLCALC, LDLDIRECT  Wt Readings from Last 3  Encounters:  12/12/19 201 lb (91.2 kg)  09/18/19 213 lb 12.8 oz (97 kg)  06/01/18 202 lb 6.4 oz (91.8 kg)     Objective:    Vital Signs:  BP 112/74   Pulse 79   Ht 5\' 6"  (1.676 m)   Wt 201 lb (91.2 kg)   BMI 32.44 kg/m    VITAL SIGNS:  reviewed  ASSESSMENT & PLAN:    1. Coronary artery disease/chest pain:Coronary CT angiography demonstrated nonobstructive coronary artery disease.  Continue aggressive risk factor modification.  I have recommended aspirin 81 mg daily and statin therapy.  I will start rosuvastatin 20 mg daily.  2. Hypertension: Controlled on present therapy which includes losartan and hydrochlorothiazide. No changes  3.  Palpitations: Symptoms are infrequent.  If they become more frequent I would consider event monitoring.    COVID-19 Education: The signs and symptoms of COVID-19 were discussed with the patient and how to seek care for testing (follow up with PCP or arrange E-visit).  The importance of social distancing was discussed today.  Time:   Today, I have spent 20 minutes with the patient with telehealth technology discussing the above problems.     Medication Adjustments/Labs and Tests Ordered: Current medicines are reviewed at length with the patient today.  Concerns regarding medicines are outlined above.   Tests Ordered: No orders of the defined types were placed in this encounter.   Medication Changes: No orders of the defined types were placed in this encounter.   Follow Up:  In Person in 6 month(s)  Signed, Kate Sable, MD  12/12/2019 9:56 AM    [facility]

## 2019-12-13 ENCOUNTER — Other Ambulatory Visit: Payer: Self-pay | Admitting: *Deleted

## 2019-12-13 MED ORDER — ROSUVASTATIN CALCIUM 20 MG PO TABS: 20.0000 mg | ORAL_TABLET | Freq: Every day | ORAL | 1 refills | Status: DC

## 2019-12-19 ENCOUNTER — Encounter: Payer: Self-pay | Admitting: *Deleted

## 2019-12-19 MED ORDER — ASPIRIN EC 81 MG PO TBEC: 81.0000 mg | DELAYED_RELEASE_TABLET | Freq: Every day | ORAL | Status: DC

## 2019-12-19 NOTE — Patient Instructions (Addendum)
Medication Instructions:   Begin Aspirin 81mg  daily.   Begin Crestor 20mg  daily - new prescription was sent to pharmacy on 12/13/2019.    Continue all other medications.    Labwork: none  Testing/Procedures: none  Follow-Up: 6 months   Any Other Special Instructions Will Be Listed Below (If Applicable).  If you need a refill on your cardiac medications before your next appointment, please call your pharmacy.

## 2019-12-19 NOTE — Addendum Note (Signed)
Addended by: Lesle Chris on: 12/19/2019 04:33 PM   Modules accepted: Orders

## 2020-05-18 ENCOUNTER — Other Ambulatory Visit: Payer: Self-pay

## 2020-05-18 ENCOUNTER — Encounter (HOSPITAL_COMMUNITY): Payer: Self-pay | Admitting: Emergency Medicine

## 2020-05-18 ENCOUNTER — Emergency Department (HOSPITAL_COMMUNITY)
Admission: EM | Admit: 2020-05-18 | Discharge: 2020-05-18 | Disposition: A | Discharge status: Home / Self Care | Payer: BC Managed Care – PPO | Attending: Emergency Medicine | Admitting: Emergency Medicine

## 2020-05-18 DIAGNOSIS — G5603 Carpal tunnel syndrome, bilateral upper limbs: Secondary | ICD-10-CM | POA: Insufficient documentation

## 2020-05-18 DIAGNOSIS — Z79899 Other long term (current) drug therapy: Secondary | ICD-10-CM | POA: Diagnosis not present

## 2020-05-18 DIAGNOSIS — E119 Type 2 diabetes mellitus without complications: Secondary | ICD-10-CM | POA: Insufficient documentation

## 2020-05-18 DIAGNOSIS — R200 Anesthesia of skin: Secondary | ICD-10-CM | POA: Diagnosis present

## 2020-05-18 DIAGNOSIS — Z7982 Long term (current) use of aspirin: Secondary | ICD-10-CM | POA: Diagnosis not present

## 2020-05-18 DIAGNOSIS — J45909 Unspecified asthma, uncomplicated: Secondary | ICD-10-CM | POA: Insufficient documentation

## 2020-05-18 DIAGNOSIS — I10 Essential (primary) hypertension: Secondary | ICD-10-CM | POA: Diagnosis not present

## 2020-05-18 LAB — CBG MONITORING, ED: Glucose-Capillary: 138 mg/dL — ABNORMAL HIGH (ref 70–99)

## 2020-05-18 MED ORDER — PREDNISONE 20 MG PO TABS: ORAL_TABLET | ORAL | 0 refills | Status: DC

## 2020-05-18 MED ORDER — DEXAMETHASONE SODIUM PHOSPHATE 10 MG/ML IJ SOLN
10.0000 mg | Freq: Once | INTRAMUSCULAR | Status: AC
  Administered 2020-05-18: 10 mg via INTRAMUSCULAR
  Filled 2020-05-18: qty 1

## 2020-05-18 NOTE — ED Provider Notes (Signed)
[facility] EMERGENCY DEPARTMENT Provider Note   CSN: [id number] Arrival date & time: 05/18/20  0350   Time seen 4:20 AM  History Chief Complaint  Patient presents with  . Numbness    Shawna Williams is a 64 y.o. female.  HPI   Patient states she was on vacation the third weekend of October and started having numbness of both of her hands.  She states it can go up to her elbow rarely goes up to her shoulder.  She states she shakes her hands and it goes away.  She states this time however it started in the afternoon of October 30 and has been there constantly in both hands.  She denies neck pain, headache, visual changes, numbness or tingling in her legs.  She states her fingers hurt and she has rheumatoid arthritis.  There is no swelling of her fingers.  She describes the numbness and pain as being stocking glove in distribution.  Patient is right-handed.  She denies doing any repetitive motions such as being a Conservation officer, nature, working on [employer].  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  PCP Kirstie Peri, MD   Past Medical History:  Diagnosis Date  . Arthritis    rhemuatoid  . Asthma    history of asthmas with a persistan cough  . Hyperlipemia   . Hypertension   . Major depressive disorder   . 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 Type 2 diabetes mellitus (HCC)    controlled by diet    Patient Active Problem List   Diagnosis Date Noted  . Cholecystitis, acute   . Calculus of gallbladder 05/16/2018  . 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 Primary osteoarthritis of both hands 06/28/2016  . Primary osteoarthritis of both feet 06/28/2016  . 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 High risk medication use 06/03/2016  . Myofascial muscle pain 06/03/2016  . Other fatigue 06/03/2016  . Other insomnia 06/03/2016  . Chronic pain syndrome 06/03/2016    Past Surgical History:  Procedure Laterality Date  . CESAREAN  SECTION  1981 and 1985  . CHOLECYSTECTOMY N/A 05/18/2018   Procedure: LAPAROSCOPIC CHOLECYSTECTOMY;  Surgeon: Lucretia Roers, MD;  Location: [facility]S;  Service: General;  Laterality: N/A;  . COLONOSCOPY N/A 01/01/2016   Procedure: COLONOSCOPY;  Surgeon: Malissa Hippo, MD;  Location: [facility] SUITE;  Service: Endoscopy;  Laterality: N/A;  930  . FOOT SURGERY Left    with screws  . TONSILLECTOMY       OB History   No obstetric history on file.     Family History  Problem Relation Age of Onset  . Stroke Mother   . Diabetes Mother   . Heart attack Father   . Cancer Sister   . Diabetes Brother   . Diabetes Sister   . Heart attack Sister     Social History   Tobacco Use  . Smoking status: Never Smoker  . Smokeless tobacco: Never Used  Vaping Use  . Vaping Use: Never used  Substance Use Topics  . Alcohol use: Not Currently    Comment: rarely  . Drug use: No  employed  Home Medications Prior to Admission medications   Medication Sig Start Date End Date Taking? Authorizing Provider  aspirin EC 81 MG tablet Take 1 tablet (81 mg total) by mouth daily. 12/19/19   Laqueta Linden, MD  cholecalciferol (VITAMIN D3) 25 MCG (1000 UNIT) tablet  Take 1,000 Units by mouth daily.    [provider]  Cinnamon 500 MG capsule Take 1,000 mg by mouth daily.    [provider]  diclofenac sodium (VOLTAREN) 1 % GEL Voltaren Gel 3 grams to 3 large joints upto TID 3 TUBES with 3 refills Patient taking differently: as needed. Voltaren Gel 3 grams to 3 large joints upto TID 3 TUBES with 3 refills 10/03/17   Pollyann Savoy, MD  folic acid (FOLVITE) 1 MG tablet Take 1 mg by mouth daily.    [provider]  LORazepam (ATIVAN) 0.5 MG tablet Take 0.5 mg by mouth daily as needed. 09/12/19   [provider]  losartan-hydrochlorothiazide (HYZAAR) 100-25 MG tablet Take 1 tablet by mouth daily. 05/30/17   [provider]  methotrexate 50 MG/2ML injection  INJECT 0.8 MLS INTO THE SKIN ONCE A WEEK Patient taking differently: Inject 60 mg into the skin every Sunday.  11/14/17   Gearldine Bienenstock, PA-C  Multiple Vitamin (MULTIVITAMIN) tablet Take 1 tablet by mouth daily.    [provider]  Multiple Vitamins-Minerals (ICAPS AREDS 2 PO) Take 1 capsule by mouth 2 (two) times daily.     [provider]  omeprazole (PRILOSEC) 20 MG capsule Take 20 mg by mouth daily. 09/13/19   [provider]  ORENCIA CLICKJECT 125 MG/ML SOAJ Inject 1 Syringe into the skin once a week. 09/09/19   [provider]  predniSONE (DELTASONE) 20 MG tablet Take 3 po QD x 3d , then 2 po QD x 3d then 1 po QD x 3d 05/18/20   Devoria Albe, MD  rosuvastatin (CRESTOR) 20 MG tablet Take 1 tablet (20 mg total) by mouth daily. 12/13/19 03/12/20  Laqueta Linden, MD  topiramate (TOPAMAX) 25 MG capsule Take 25 mg by mouth daily.    [provider]  traMADol (ULTRAM) 50 MG tablet Take 1 tablet (50 mg total) by mouth at bedtime. Patient taking differently: Take 50 mg by mouth every 12 (twelve) hours as needed for moderate pain.  01/17/18   Gearldine Bienenstock, PA-C  traZODone (DESYREL) 50 MG tablet TAKE 1 TABLET BY MOUTH AT BEDTIME Patient taking differently: Take 50 mg by mouth at bedtime as needed for sleep.  11/24/17   Gearldine Bienenstock, PA-C  venlafaxine XR (EFFEXOR-XR) 75 MG 24 hr capsule Take 75 mg by mouth daily. 04/24/18   [provider]    Allergies    Ketorolac, Diltiazem, and Codeine  Review of Systems   Review of Systems  All other systems reviewed and are negative.   Physical Exam Updated Vital Signs BP (!) 155/105   Pulse 95   Temp 97.9 F (36.6 C)   Resp 17   Ht 5\' 6"  (1.676 m)   Wt 95.3 kg   SpO2 99%   BMI 33.89 kg/m   Physical Exam Vitals and nursing note reviewed.  Constitutional:      Appearance: Normal appearance. She is obese.  HENT:     Head: Normocephalic and atraumatic.     Right Ear: External ear normal.      Left Ear: External ear normal.  Eyes:     Extraocular Movements: Extraocular movements intact.     Conjunctiva/sclera: Conjunctivae normal.  Cardiovascular:     Rate and Rhythm: Normal rate.  Pulmonary:     Effort: No respiratory distress.  Musculoskeletal:     Cervical back: Normal range of motion.     Comments: On observation of her  hands there is no obvious swelling, redness, or prominence of her joints.  She has good range of motion of her fingers although she states she is having a hard time making a fist.  She has positive Tinel's sign bilaterally, positive Phalen's test.  While she sitting discussing her symptoms patient is seen at rubbing the volar aspect of her wrist.  There is no obvious thenar wasting.  Her opposition of the thumb and finger are mildly weak bilaterally.  Her ulnar nerve is intact motor.  Skin:    General: Skin is warm and dry.  Neurological:     General: No focal deficit present.     Mental Status: She is alert and oriented to person, place, and time.     Cranial Nerves: No cranial nerve deficit.  Psychiatric:        Mood and Affect: Mood normal.        Behavior: Behavior normal.     ED Results / Procedures / Treatments   Labs (all labs ordered are listed, but only abnormal results are displayed) Labs Reviewed  CBG MONITORING, ED - Abnormal; Notable for the following components:      Result Value   Glucose-Capillary 138 (*)    All other components within normal limits    EKG None  Radiology No results found.  Procedures Procedures (including critical care time)  Medications Ordered in ED Medications  dexamethasone (DECADRON) injection 10 mg (10 mg Intramuscular Given 05/18/20 0449)    ED Course  I have reviewed the triage vital signs and the nursing notes.  Pertinent labs & imaging results that were available during my care of the patient were reviewed by me and considered in my medical decision making (see chart for details).    MDM  Rules/Calculators/A&P                         We discussed that the bilateral nature of her numbness would make stroke extremely unlikely.  She could have a cervical spine problem however she denies any neck pain or neck injury.  It would also less likely to be acutely present bilaterally.  Her exam is most consistent with carpal tunnel syndrome.  Patient was given Decadron IM and placed in a bilateral wrist splint.  She was referred to orthopedics.   Final Clinical Impression(s) / ED Diagnoses Final diagnoses:  Bilateral carpal tunnel syndrome    Rx / DC Orders ED Discharge Orders         Ordered    predniSONE (DELTASONE) 20 MG tablet        05/18/20 0452         Plan discharge  Devoria Albe, MD, Concha Pyo, MD 05/18/20 [phone]

## 2020-05-18 NOTE — ED Triage Notes (Addendum)
Pt reports bilateral hand numbness since yesterday afternoon; pt reports she feels as if the numbness is going up her left arm

## 2020-05-18 NOTE — Discharge Instructions (Addendum)
Wear the wrist splints especially at night and wear it on your worst one during the day if you can't wear both.  Take the prednisone as prescribed.  Please call Dr. Mort Sawyers office, the orthopedist on-call, to get a follow-up appointment.  Please be careful while on the prednisone about your diet, the prednisone can make your blood sugar get high.

## 2020-05-20 ENCOUNTER — Ambulatory Visit: Payer: BC Managed Care – PPO | Admitting: Orthopaedic Surgery

## 2020-05-20 ENCOUNTER — Other Ambulatory Visit: Payer: Self-pay

## 2020-05-20 ENCOUNTER — Ambulatory Visit: Payer: Self-pay

## 2020-05-20 ENCOUNTER — Inpatient Hospital Stay: Payer: BC Managed Care – PPO | Admitting: Orthopaedic Surgery

## 2020-05-20 ENCOUNTER — Encounter: Payer: Self-pay | Admitting: Orthopaedic Surgery

## 2020-05-20 VITALS — Ht 66.0 in | Wt 210.0 lb

## 2020-05-20 DIAGNOSIS — R200 Anesthesia of skin: Secondary | ICD-10-CM

## 2020-05-20 DIAGNOSIS — G5603 Carpal tunnel syndrome, bilateral upper limbs: Secondary | ICD-10-CM | POA: Diagnosis not present

## 2020-05-20 NOTE — Progress Notes (Signed)
Office Visit Note   Patient: Shawna Williams           Date of Birth: [birth year]           MRN: [id number] Visit Date: 05/20/2020              Requested by: Kirstie Peri, MD [address] [place],  [place] [address] PCP: Kirstie Peri, MD   Assessment & Plan: Visit Diagnoses:  1. Bilateral hand numbness   2. Bilateral carpal tunnel syndrome     Plan: Patient's exam suggest this is not coming from cervical spine.  No evidence of C1-C2 instability on flexion-extension C-spine x-rays.  She has some thenar atrophy on the left positive carpal compression test left worse than right.  Will obtain some EMGs nerve conduction velocities.  She continue with the night splints.  Office follow-up after electrical test for review.  We discussed pathophysiology of carpal tunnel syndrome.  Treatment options were discussed.  Follow-up after electrical test.  Follow-Up Instructions: No follow-ups on file.   Orders:  Orders Placed This Encounter  Procedures  . XR Cervical Spine 2 or 3 views  . Ambulatory referral to Physical Medicine Rehab   No orders of the defined types were placed in this encounter.     Procedures: No procedures performed   Clinical Data: No additional findings.   Subjective: Chief Complaint  Patient presents with  . Right Hand - Numbness  . Left Hand - Numbness    HPI 64 year old female with diagnosis of rheumatoid arthritis on Enbrel and methotrexate went to the ER on 05/18/2020 with severe numbness in both hands cramping.  Fingers hurt so bad she could not use her hands.  Patient is right-hand dominant was having more problems on the left and right hand.  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  Patient was placed in night splints which is helped her symptoms.  She still is having some numbness has dropped objects but is sleeping better.  Review of Systems Positive for type 2 diabetes rheumatoid arthritis on [employer].  History of asthma hypertension.   Negative for neck pain no gait disturbance no myelopathic symptoms.  Objective: Vital Signs: Ht 5\' 6"  (1.676 m)   Wt 210 lb (95.3 kg)   BMI 33.89 kg/m   Physical Exam Constitutional:      Appearance: She is well-developed.  HENT:     Head: Normocephalic.     Right Ear: External ear normal.     Left Ear: External ear normal.  Eyes:     Pupils: Pupils are equal, round, and reactive to light.  Neck:     Thyroid: No thyromegaly.     Trachea: No tracheal deviation.  Cardiovascular:     Rate and Rhythm: Normal rate.  Pulmonary:     Effort: Pulmonary effort is normal.  Abdominal:     Palpations: Abdomen is soft.  Skin:    General: Skin is warm and dry.  Neurological:     Mental Status: She is alert and oriented to person, place, and time.  Psychiatric:        Behavior: Behavior normal.     Ortho Exam patient has negative Spurling no brachial plexus tenderness no increased pain with compression no relief with distraction of the cervical spine.  Ulnar nerve at the elbow is normal.  She has pain with compression of the carpal canal but negative Phalen's on the left and right at 60 seconds.  There is thenar  weakness with resisted abduction on the left and test normal on the right.  Hyperthenar interossei are normal fundi are normal.  Decreased sensation median distribution left hand increased two-point by 5 mm radial 3 and half fingers.  Specialty Comments:  No specialty comments available.  Imaging: XR Cervical Spine 2 or 3 views  Result Date: 05/20/2020 Three-view cervical spine x-rays [facility] and lateral flexion-extension x-rays obtained.  This shows slight narrowing C5-6 and some left C5-6 facet spurring.  No listhesis.  C1-C2 is normal. Impression: Unremarkable C-spine radiographs.  No C1-C2 instability in this patient with rheumatoid arthritis.    PMFS History: Patient Active Problem List   Diagnosis Date Noted  . Bilateral carpal tunnel syndrome 05/20/2020  . Cholecystitis,  acute   . Calculus of gallbladder 05/16/2018  . 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 Primary osteoarthritis of both hands 06/28/2016  . Primary osteoarthritis of both feet 06/28/2016  . 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 High risk medication use 06/03/2016  . Myofascial muscle pain 06/03/2016  . Other fatigue 06/03/2016  . Other insomnia 06/03/2016  . Chronic pain syndrome 06/03/2016   Past Medical History:  Diagnosis Date  . Arthritis    rhemuatoid  . Asthma    history of asthmas with a persistan cough  . Hyperlipemia   . Hypertension   . Major depressive disorder   . 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 Type 2 diabetes mellitus (HCC)    controlled by diet    Family History  Problem Relation Age of Onset  . Stroke Mother   . Diabetes Mother   . Heart attack Father   . Cancer Sister   . Diabetes Brother   . Diabetes Sister   . Heart attack Sister     Past Surgical History:  Procedure Laterality Date  . CESAREAN SECTION  1981 and 1985  . CHOLECYSTECTOMY N/A 05/18/2018   Procedure: LAPAROSCOPIC CHOLECYSTECTOMY;  Surgeon: Lucretia Roers, MD;  Location: [facility];  Service: General;  Laterality: N/A;  . COLONOSCOPY N/A 01/01/2016   Procedure: COLONOSCOPY;  Surgeon: Malissa Hippo, MD;  Location: [facility];  Service: Endoscopy;  Laterality: N/A;  930  . FOOT SURGERY Left    with screws  . TONSILLECTOMY     Social History   Occupational History  . Not on file  Tobacco Use  . Smoking status: Never Smoker  . Smokeless tobacco: Never Used  Vaping Use  . Vaping Use: Never used  Substance and Sexual Activity  . Alcohol use: Not Currently    Comment: rarely  . Drug use: No  . Sexual activity: Not on file

## 2020-05-29 ENCOUNTER — Other Ambulatory Visit: Payer: Self-pay | Admitting: *Deleted

## 2020-05-29 MED ORDER — ROSUVASTATIN CALCIUM 20 MG PO TABS: 20.0000 mg | ORAL_TABLET | Freq: Every day | ORAL | 0 refills | Status: DC

## 2020-06-09 ENCOUNTER — Telehealth: Payer: Self-pay

## 2020-06-09 NOTE — Telephone Encounter (Signed)
Left message #1

## 2020-06-09 NOTE — Telephone Encounter (Signed)
Would you be able to answer patient's questions about the study? Thank you.

## 2020-06-09 NOTE — Telephone Encounter (Signed)
Patient called stating she has a nerve conduction study she stated she has a couple questions about the study she would like a call back. [phone]

## 2020-06-10 ENCOUNTER — Ambulatory Visit: Payer: BC Managed Care – PPO | Admitting: Podiatry

## 2020-06-10 ENCOUNTER — Other Ambulatory Visit: Payer: Self-pay

## 2020-06-10 ENCOUNTER — Ambulatory Visit (INDEPENDENT_AMBULATORY_CARE_PROVIDER_SITE_OTHER): Payer: BC Managed Care – PPO

## 2020-06-10 DIAGNOSIS — M19079 Primary osteoarthritis, unspecified ankle and foot: Secondary | ICD-10-CM

## 2020-06-10 DIAGNOSIS — G8929 Other chronic pain: Secondary | ICD-10-CM | POA: Diagnosis not present

## 2020-06-10 DIAGNOSIS — M722 Plantar fascial fibromatosis: Secondary | ICD-10-CM

## 2020-06-10 DIAGNOSIS — M79673 Pain in unspecified foot: Secondary | ICD-10-CM

## 2020-06-10 MED ORDER — LIDOCAINE 4 % EX PTCH: 1.0000 "application " | MEDICATED_PATCH | Freq: Every day | CUTANEOUS | 0 refills | Status: DC | PRN

## 2020-06-10 NOTE — Telephone Encounter (Signed)
Patient wanted to be added to wait list. This was done. She also wanted to know if we could do both sides. She is already scheduled for bilateral.

## 2020-06-16 NOTE — Progress Notes (Signed)
Subjective:   Patient ID: Shawna Williams, female   DOB: 64 y.o.   MRN: [id number]   HPI 64 year old female with history of rheumatoid arthritis presents the office today for concerns of chronic foot pain on both feet.  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  Pain is intermittent.  No recent injury or falls.  No swelling.  She gets pain to the tops of her feet she also has noticed a knot on the left arch.  This been ongoing for some time has not changed in size.  No recent treatment for these issues.  No other concerns today.   Review of Systems  All other systems reviewed and are negative.  Past Medical History:  Diagnosis Date  . Arthritis    rhemuatoid  . Asthma    history of asthmas with a persistan cough  . Hyperlipemia   . Hypertension   . Major depressive disorder   . 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 Type 2 diabetes mellitus (HCC)    controlled by diet    Past Surgical History:  Procedure Laterality Date  . CESAREAN SECTION  1981 and 1985  . CHOLECYSTECTOMY N/A 05/18/2018   Procedure: LAPAROSCOPIC CHOLECYSTECTOMY;  Surgeon: Lucretia Roers, MD;  Location: [facility];  Service: General;  Laterality: N/A;  . COLONOSCOPY N/A 01/01/2016   Procedure: COLONOSCOPY;  Surgeon: Malissa Hippo, MD;  Location: [facility];  Service: Endoscopy;  Laterality: N/A;  930  . FOOT SURGERY Left    with screws  . TONSILLECTOMY       Current Outpatient Medications:  .  aspirin EC 81 MG tablet, Take 1 tablet (81 mg total) by mouth daily., Disp: , Rfl:  .  cholecalciferol (VITAMIN D3) 25 MCG (1000 UNIT) tablet, Take 1,000 Units by mouth daily., Disp: , Rfl:  .  Cinnamon 500 MG capsule, Take 1,000 mg by mouth daily., Disp: , Rfl:  .  diclofenac sodium (VOLTAREN) 1 % GEL, Voltaren Gel 3 grams to 3 large joints upto TID 3 TUBES with 3 refills (Patient taking differently: as needed.  Voltaren Gel 3 grams to 3 large joints upto TID 3 TUBES with 3 refills), Disp: 3 Tube, Rfl: 3 .  folic acid (FOLVITE) 1 MG tablet, Take 1 mg by mouth daily., Disp: , Rfl:  .  Lidocaine (HM LIDOCAINE PATCH) 4 % PTCH, Apply 1 application topically daily as needed., Disp: 30 patch, Rfl: 0 .  LORazepam (ATIVAN) 0.5 MG tablet, Take 0.5 mg by mouth daily as needed., Disp: , Rfl:  .  losartan-hydrochlorothiazide (HYZAAR) 100-25 MG tablet, Take 1 tablet by mouth daily., Disp: , Rfl:  .  methotrexate 50 MG/2ML injection, INJECT 0.8 MLS INTO THE SKIN ONCE A WEEK (Patient taking differently: Inject 60 mg into the skin every Sunday. ), Disp: 4 mL, Rfl: 2 .  Multiple Vitamin (MULTIVITAMIN) tablet, Take 1 tablet by mouth daily., Disp: , Rfl:  .  Multiple Vitamins-Minerals (ICAPS AREDS 2 PO), Take 1 capsule by mouth 2 (two) times daily. , Disp: , Rfl:  .  omeprazole (PRILOSEC) 20 MG capsule, Take 20 mg by mouth daily., Disp: , Rfl:  .  ORENCIA CLICKJECT 125 MG/ML SOAJ, Inject 1 Syringe into the skin once a week., Disp: , Rfl:  .  predniSONE (DELTASONE) 20 MG tablet, Take 3 po QD x 3d , then 2 po QD x 3d  then 1 po QD x 3d, Disp: 18 tablet, Rfl: 0 .  rosuvastatin (CRESTOR) 20 MG tablet, Take 1 tablet (20 mg total) by mouth daily., Disp: 90 tablet, Rfl: 0 .  topiramate (TOPAMAX) 25 MG capsule, Take 25 mg by mouth daily., Disp: , Rfl:  .  traMADol (ULTRAM) 50 MG tablet, Take 1 tablet (50 mg total) by mouth at bedtime. (Patient taking differently: Take 50 mg by mouth every 12 (twelve) hours as needed for moderate pain. ), Disp: 30 tablet, Rfl: 0 .  traZODone (DESYREL) 50 MG tablet, TAKE 1 TABLET BY MOUTH AT BEDTIME (Patient taking differently: Take 50 mg by mouth at bedtime as needed for sleep. ), Disp: 30 tablet, Rfl: 0 .  venlafaxine XR (EFFEXOR-XR) 75 MG 24 hr capsule, Take 75 mg by mouth daily., Disp: , Rfl: 3  Allergies  Allergen Reactions  . Ketorolac Shortness Of Breath and Other (See Comments)    Edema  .  Diltiazem Other (See Comments)    Fatigue  . Codeine Nausea Only          Objective:  Physical Exam  General: AAO x3, NAD  Dermatological: Skin is warm, dry and supple bilateral. There are no open sores, no preulcerative lesions, no rash or signs of infection present.  Vascular: Dorsalis Pedis artery and Posterior Tibial artery pedal pulses are 2/4 bilateral with immedate capillary fill time.  There is no pain with calf compression, swelling, warmth, erythema.   Neruologic: Grossly intact via light touch bilateral.  Negative Tinel sign.  Musculoskeletal: Tenderness on the dorsal medial aspect of the midfoot mostly on the navicular cuneiform joint area.  Flexor, extensor tendons appear to be intact.  On the medial band plantar fashion the arch of the left side is a firm nonmobile soft tissue mass consistent with a plantar fibroma.  Flexor, extensor tendons appear to be intact.  Muscular strength 5/5 in all groups tested bilateral.  Gait: Unassisted, Nonantalgic.       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  Arthritic changes present of the midfoot.  There is no evidence of acute fracture identified today. -Regarding the chronic foot pain we discussed topical medications.  Also offered steroid injection today.  Discussed shoe modifications and surgical shoes with good arch support.  Prescribed lidocaine patches to use as needed.  Discussed topicals including Voltaren gel, Aspercreme lidocaine which was tried.  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  Offered steroid injection we would offer this.  Discussed traction exercises. Vivi Barrack DPM

## 2020-06-23 ENCOUNTER — Ambulatory Visit: Payer: BC Managed Care – PPO | Admitting: Family Medicine

## 2020-06-26 NOTE — Progress Notes (Signed)
Cardiology Office Note  Date: 06/26/2020   ID: Shawna Williams, DOB 12-10-1955, MRN [id number]  PCP:  Kirstie Peri, MD  Cardiologist:  No primary care provider on file. Electrophysiologist:  None   Chief Complaint: Follow-up chest pain  History of Present Illness: Shawna Williams is a 64 y.o. female with a history of chest pain, HLD, HTN, RA, type 2 diabetes.  Last encounter with Dr. Purvis Sheffield 12/12/2019 via telemedicine. 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 She was having very seldom palpitations. Previous coronary CT demonstrated nonobstructive CAD. Continue with aggressive risk factor modification. Aspirin and statin medication was recommended. Rosuvastatin was started at 20 mg daily. BP controlled on current therapy including losartan and hydrochlorothiazide. Having infrequent palpitations. If increased would consider event monitoring.  She is here today for 77-month follow-up.  She denies any recent acute illnesses or hospitalizations.  States she is having some problems with some abnormal numbness in her hands.  She believes it is attributable to carpal tunnel syndrome.  She is pending nerve conduction studies per her statement.  Otherwise she denies any anginal or exertional symptoms, palpitations or arrhythmias, orthostatic symptoms, CVA or TIA-like symptoms, bleeding issues, PND, orthopnea.  Denies any claudication-like symptoms, DVT or PE-like symptoms, or lower extremity edema.  She needs a refill on her Crestor.  Still seeing a rheumatologist for her RA.  She is on Orencia which seems to help per her statement  Past Medical History:  Diagnosis Date  . Arthritis    rhemuatoid  . Asthma    history of asthmas with a persistan cough  . Hyperlipemia   . Hypertension   . Major depressive disorder   . 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 Type 2 diabetes mellitus (HCC)    controlled by diet    Past Surgical History:   Procedure Laterality Date  . CESAREAN SECTION  1981 and 1985  . CHOLECYSTECTOMY N/A 05/18/2018   Procedure: LAPAROSCOPIC CHOLECYSTECTOMY;  Surgeon: Lucretia Roers, MD;  Location: [facility];  Service: General;  Laterality: N/A;  . COLONOSCOPY N/A 01/01/2016   Procedure: COLONOSCOPY;  Surgeon: Malissa Hippo, MD;  Location: [facility];  Service: Endoscopy;  Laterality: N/A;  930  . FOOT SURGERY Left    with screws  . TONSILLECTOMY      Current Outpatient Medications  Medication Sig Dispense Refill  . aspirin EC 81 MG tablet Take 1 tablet (81 mg total) by mouth daily.    . cholecalciferol (VITAMIN D3) 25 MCG (1000 UNIT) tablet Take 1,000 Units by mouth daily.    . Cinnamon 500 MG capsule Take 1,000 mg by mouth daily.    . diclofenac sodium (VOLTAREN) 1 % GEL Voltaren Gel 3 grams to 3 large joints upto TID 3 TUBES with 3 refills (Patient taking differently: as needed. Voltaren Gel 3 grams to 3 large joints upto TID 3 TUBES with 3 refills) 3 Tube 3  . folic acid (FOLVITE) 1 MG tablet Take 1 mg by mouth daily.    . Lidocaine (HM LIDOCAINE PATCH) 4 % PTCH Apply 1 application topically daily as needed. 30 patch 0  . LORazepam (ATIVAN) 0.5 MG tablet Take 0.5 mg by mouth daily as needed.    Marland Kitchen losartan-hydrochlorothiazide (HYZAAR) 100-25 MG tablet Take 1 tablet by mouth daily.    . methotrexate 50 MG/2ML injection INJECT 0.8 MLS INTO THE SKIN ONCE A WEEK (Patient taking differently: Inject 60 mg into  the skin every Sunday. ) 4 mL 2  . Multiple Vitamin (MULTIVITAMIN) tablet Take 1 tablet by mouth daily.    . Multiple Vitamins-Minerals (ICAPS AREDS 2 PO) Take 1 capsule by mouth 2 (two) times daily.     Marland Kitchen omeprazole (PRILOSEC) 20 MG capsule Take 20 mg by mouth daily.    Marland Kitchen ORENCIA CLICKJECT 125 MG/ML SOAJ Inject 1 Syringe into the skin once a week.    . predniSONE (DELTASONE) 20 MG tablet Take 3 po QD x 3d , then 2 po QD x 3d then 1 po QD x 3d 18 tablet 0  . rosuvastatin (CRESTOR) 20 MG tablet Take  1 tablet (20 mg total) by mouth daily. 90 tablet 0  . topiramate (TOPAMAX) 25 MG capsule Take 25 mg by mouth daily.    . traMADol (ULTRAM) 50 MG tablet Take 1 tablet (50 mg total) by mouth at bedtime. (Patient taking differently: Take 50 mg by mouth every 12 (twelve) hours as needed for moderate pain. ) 30 tablet 0  . traZODone (DESYREL) 50 MG tablet TAKE 1 TABLET BY MOUTH AT BEDTIME (Patient taking differently: Take 50 mg by mouth at bedtime as needed for sleep. ) 30 tablet 0  . venlafaxine XR (EFFEXOR-XR) 75 MG 24 hr capsule Take 75 mg by mouth daily.  3   No current facility-administered medications for this visit.   Allergies:  Ketorolac, Diltiazem, and Codeine   Social History: The patient  reports that she has never smoked. She has never used smokeless tobacco. She reports previous alcohol use. She reports that she does not use drugs.   Family History: The patient's family history includes Cancer in her sister; Diabetes in her brother, mother, and sister; Heart attack in her father and sister; Stroke in her mother.   ROS:  Please see the history of present illness. Otherwise, complete review of systems is positive for none.  All other systems are reviewed and negative.   Physical Exam: VS:  There were no vitals taken for this visit., BMI There is no height or weight on file to calculate BMI.  Wt Readings from Last 3 Encounters:  05/20/20 210 lb (95.3 kg)  05/18/20 210 lb (95.3 kg)  12/12/19 201 lb (91.2 kg)    General: Patient appears comfortable at rest. HEENT: Conjunctiva and lids normal, oropharynx clear with moist mucosa. Neck: Supple, no elevated JVP or carotid bruits, no thyromegaly. Lungs: Clear to auscultation, nonlabored breathing at rest. Cardiac: Regular rate and rhythm, no S3 or significant systolic murmur, no pericardial rub. Extremities: No pitting edema, distal pulses 2+. Skin: Warm and dry. Musculoskeletal: No kyphosis. Neuropsychiatric: Alert and oriented x3,  affect grossly appropriate.  ECG:  An ECG dated 06/27/2020 was personally reviewed today and demonstrated:  Normal sinus rhythm rate of 84.  Cannot rule out anterior infarct, age undetermined  Recent Labwork: 10/15/2019: BUN 19; Creat 0.95; Potassium 3.7; Sodium 139  No results found for: CHOL, TRIG, HDL, CHOLHDL, VLDL, LDLCALC, LDLDIRECT  Other Studies Reviewed Today: Coronary CT angiography 10/19/19: Coronary Arteries: Normal coronary origin. Right dominance.  Left main: The left main is a large caliber vessel with a normal take off from the left coronary cusp that trifurcates to form a left anterior descending artery, ramus intermedius, and a left circumflex artery. There is no plaque or stenosis.  Left anterior descending artery: The proximal LAD contains a moderate mixed-density plaque (50-69%). The mid and distal LAD segments contain minimal noncalcified plaque (<25%). The first diagonal branch  contains moderate calcified plaque (50-69%).  Ramus Intermedius: Patent without plaque or stenosis.  LAD is patent without evidence of plaque or stenosis. The LAD gives off 2 patent diagonal branches.  Left circumflex artery: The LCX is non-dominant and patent with minimal non-calcified plaque (<25%). The LCX gives off 2 patent obtuse marginal branches.  Right coronary artery: The RCA is dominant with normal take off from the right coronary cusp. The proximal and mid RCA segments contain minimal calcified plaque (<25%). The RCA terminates as a PDA and right posterolateral branch without evidence of plaque or stenosis.  Right Atrium: Right atrial size is within normal limits.  Right Ventricle: The right ventricular cavity is within normal limits.  Left Atrium: Left atrial size is normal in size with no left atrial appendage filling defect.  Left Ventricle: The ventricular cavity size is within normal limits. There are no stigmata of prior infarction. There is no  abnormal filling defect.  Pulmonary arteries: Normal in size without proximal filling defect.  Pulmonary veins: Normal pulmonary venous drainage.  Pericardium: Normal thickness with no significant effusion or calcium present.  Cardiac valves: The aortic valve is trileaflet without significant calcification. The mitral valve is normal structure without significant calcification.  Aorta: Normal caliber with no significant disease.  Extra-cardiac findings: See attached radiology report for non-cardiac structures.  IMPRESSION: 1. Coronary calcium score of 365. This was 95th percentile for age and sex matched controls.  2. Normal coronary origin with right dominance.  3. Moderate stenosis (50-69%) in the proximal LAD.  4. Moderate stenosis (50-69%) in the first diagonal.  5. Minimal (<25%) CAD in the LCX/RCA.  RECOMMENDATIONS: 1. Moderate stenosis. Consider symptom-guided anti-ischemic pharmacotherapy as well as risk factor modification per guideline directed care. Additional analysis with CT FFR will be submitted.  1. Left Main: 0.98; no significant stenosis.  2. Mid LAD: 0.86; no significant stenosis. 3. First Diagonal: 0.81; no significant stenosis. 4. LCX: 0.92; no significant stenosis. 5. RCA: 0.92; no significant stenosis.  IMPRESSION: 1. CT FFR of mid LAD and D1 are negative  Assessment and Plan:  1. CAD in native artery   2. Essential hypertension   3. Palpitations    1. CAD in native artery 10/19/2019 CT scan with calcium scoring and FFR showed calcium score 95th percentile for age and sex max control.  Showed moderate stenosis 50 to 69% stenosis in proximal LAD.  Moderate stenosis 50 to 69% first diagonal.  Minimal less than 25% CAD and LCx/RCA.  Left main 0.98; no significant stenosis, mid LAD: 0.8; no significant stenosis, first diagonal: 0.81; no significant stenosis, LCx: 0.92; no significant stenosis, RCA: 0.92; no significant stenosis.  CT  FFR of mid LAD and D1 negative.  She denies any anginal or exertional symptoms.  Continue aspirin 81 mg daily.  Continue Crestor 20 mg daily.  Please refill Crestor.  2. Essential hypertension Blood pressure well controlled today at 122/72.  Continue losartan hydrochlorothiazide 100/25 mg daily.  3. Palpitations Denies any palpitations.  EKG today shows normal sinus rhythm with rate of 84.  No ectopy noted.  Medication Adjustments/Labs and Tests Ordered: Current medicines are reviewed at length with the patient today.  Concerns regarding medicines are outlined above.   Disposition: Follow-up with Dr. Diona Browner or [facility] 6 months  Signed, Rennis Harding, NP 06/26/2020 8:55 PM    [facility] at [facility] [address] [place], [place], [place] [address] Phone: [phone]; Fax: [phone]

## 2020-06-27 ENCOUNTER — Encounter: Payer: Self-pay | Admitting: Family Medicine

## 2020-06-27 ENCOUNTER — Ambulatory Visit: Payer: BC Managed Care – PPO | Admitting: Family Medicine

## 2020-06-27 ENCOUNTER — Other Ambulatory Visit: Payer: Self-pay

## 2020-06-27 VITALS — BP 122/72 | HR 84 | Ht 65.0 in | Wt 214.0 lb

## 2020-06-27 DIAGNOSIS — R002 Palpitations: Secondary | ICD-10-CM

## 2020-06-27 DIAGNOSIS — I2510 Atherosclerotic heart disease of native coronary artery without angina pectoris: Secondary | ICD-10-CM | POA: Diagnosis not present

## 2020-06-27 DIAGNOSIS — I10 Essential (primary) hypertension: Secondary | ICD-10-CM | POA: Diagnosis not present

## 2020-06-27 MED ORDER — ROSUVASTATIN CALCIUM 20 MG PO TABS: 20.0000 mg | ORAL_TABLET | Freq: Every day | ORAL | 3 refills | Status: DC

## 2020-06-27 NOTE — Patient Instructions (Signed)

## 2020-07-04 ENCOUNTER — Encounter: Payer: Self-pay | Admitting: Physical Medicine and Rehabilitation

## 2020-07-04 ENCOUNTER — Ambulatory Visit: Payer: BC Managed Care – PPO | Admitting: Physical Medicine and Rehabilitation

## 2020-07-04 ENCOUNTER — Other Ambulatory Visit: Payer: Self-pay

## 2020-07-04 DIAGNOSIS — R202 Paresthesia of skin: Secondary | ICD-10-CM

## 2020-07-04 NOTE — Progress Notes (Signed)
Sometimes has painful knot left anterior wrist. Bilateral hand pain. Numbness and tingling in both hands. Right hand dominant No lotion per patient Numeric Pain Rating Scale and Functional Assessment Average Pain 5   In the last MONTH (on 0-10 scale) has pain interfered with the following?  1. General activity like being  able to carry out your everyday physical activities such as walking, climbing stairs, carrying groceries, or moving a chair?  Rating(5)

## 2020-07-07 NOTE — Procedures (Signed)
EMG & NCV Findings: Evaluation of the left median motor nerve showed prolonged distal onset latency (4.3 ms) and decreased conduction velocity (Elbow-Wrist, 47 m/s).  The right median motor nerve showed prolonged distal onset latency (4.5 ms), reduced amplitude (4.9 mV), and decreased conduction velocity (Elbow-Wrist, 43 m/s).  The left median (across palm) sensory nerve showed prolonged distal peak latency (Wrist, 4.9 ms) and prolonged distal peak latency (Palm, 2.1 ms).  The right median (across palm) sensory nerve showed prolonged distal peak latency (Wrist, 4.6 ms).  All remaining nerves (as indicated in the following tables) were within normal limits.  All left vs. right side differences were within normal limits.    All examined muscles (as indicated in the following table) showed no evidence of electrical instability.    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   There is no significant electrodiagnostic evidence of any other focal nerve entrapment, brachial plexopathy or cervical radiculopathy.   Recommendations: 1.  Follow-up with referring physician. 2.  Continue current management of symptoms. 3.  Continue use of resting splint at night-time and as needed during the day. 4.  Suggest surgical evaluation.  ___________________________ Shawna Williams FAAPMR Board Certified, American Board of Physical Medicine and Rehabilitation    Nerve Conduction Studies Anti Sensory Summary Table   Stim Site NR Peak (ms) Norm Peak (ms) P-T Amp (V) Norm P-T Amp Site1 Site2 Delta-P (ms) Dist (cm) Vel (m/s) Norm Vel (m/s)  Left Median Acr Palm Anti Sensory (2nd Digit)  32.3C  Wrist    *4.9 <3.6 16.2 >10 Wrist Palm 2.8 0.0    Palm    *2.1 <2.0 21.8         Right Median Acr Palm Anti Sensory (2nd Digit)  30.6C  Wrist    *4.6 <3.6 32.7 >10  Wrist Palm 2.6 0.0    Palm    2.0 <2.0 21.2         Left Radial Anti Sensory (Base 1st Digit)  31.8C  Wrist    2.1 <3.1 21.8  Wrist Base 1st Digit 2.1 0.0    Right Radial Anti Sensory (Base 1st Digit)  30.9C  Wrist    2.1 <3.1 25.2  Wrist Base 1st Digit 2.1 0.0    Left Ulnar Anti Sensory (5th Digit)  32.7C  Wrist    3.4 <3.7 19.7 >15.0 Wrist 5th Digit 3.4 14.0 41 >38  Right Ulnar Anti Sensory (5th Digit)  31.5C  Wrist    3.4 <3.7 18.8 >15.0 Wrist 5th Digit 3.4 14.0 41 >38   Motor Summary Table   Stim Site NR Onset (ms) Norm Onset (ms) O-P Amp (mV) Norm O-P Amp Site1 Site2 Delta-0 (ms) Dist (cm) Vel (m/s) Norm Vel (m/s)  Left Median Motor (Abd Poll Brev)  32.5C  Wrist    *4.3 <4.2 5.0 >5 Elbow Wrist 4.7 22.3 *47 >50  Elbow    9.0  4.9         Right Median Motor (Abd Poll Brev)  31.5C  Wrist    *4.5 <4.2 *4.9 >5 Elbow Wrist 5.3 23.0 *43 >50  Elbow    9.8  4.8         Left Ulnar Motor (Abd Dig Min)  32.7C  Wrist    2.8 <4.2 7.6 >3 B Elbow Wrist 3.2 20.5 64 >53  B Elbow    6.0  7.3  A Elbow B Elbow 1.6 10.0 63 >53  A Elbow    7.6  6.9         Right Ulnar Motor (Abd Dig Min)  32C  Wrist    2.9 <4.2 6.9 >3 B Elbow Wrist 3.0 20.0 67 >53  B Elbow    5.9  7.0  A Elbow B Elbow 1.4 10.0 71 >53  A Elbow    7.3  6.7          EMG   Side Muscle Nerve Root Ins Act Fibs Psw Amp Dur Poly Recrt Int Dennie Bible Comment  Right Abd Poll Brev Median C8-T1 Nml Nml Nml Nml Nml 0 Nml Nml   Right 1stDorInt Ulnar C8-T1 Nml Nml Nml Nml Nml 0 Nml Nml   Left Abd Poll Brev Median C8-T1 Nml Nml Nml Nml Nml 0 Nml Nml   Left 1stDorInt Ulnar C8-T1 Nml Nml Nml Nml Nml 0 Nml Nml     Nerve Conduction Studies Anti Sensory Left/Right Comparison   Stim Site L Lat (ms) R Lat (ms) L-R Lat (ms) L Amp (V) R Amp (V) L-R Amp (%) Site1 Site2 L Vel (m/s) R Vel (m/s) L-R Vel (m/s)  Median Acr Palm Anti Sensory (2nd Digit)  32.3C  Wrist *4.9 *4.6 0.3 16.2 32.7 50.5 Wrist Palm     Palm *2.1 2.0 0.1 21.8 21.2 2.8        Radial Anti Sensory (Base 1st Digit)  31.8C  Wrist 2.1 2.1 0.0 21.8 25.2 13.5 Wrist Base 1st Digit     Ulnar Anti Sensory (5th Digit)  32.7C  Wrist 3.4 3.4 0.0 19.7 18.8 4.6 Wrist 5th Digit 41 41 0   Motor Left/Right Comparison   Stim Site L Lat (ms) R Lat (ms) L-R Lat (ms) L Amp (mV) R Amp (mV) L-R Amp (%) Site1 Site2 L Vel (m/s) R Vel (m/s) L-R Vel (m/s)  Median Motor (Abd Poll Brev)  32.5C  Wrist *4.3 *4.5 0.2 5.0 *4.9 2.0 Elbow Wrist *47 *43 4  Elbow 9.0 9.8 0.8 4.9 4.8 2.0       Ulnar Motor (Abd Dig Min)  32.7C  Wrist 2.8 2.9 0.1 7.6 6.9 9.2 B Elbow Wrist 64 67 3  B Elbow 6.0 5.9 0.1 7.3 7.0 4.1 A Elbow B Elbow 63 71 8  A Elbow 7.6 7.3 0.3 6.9 6.7 2.9          Waveforms:

## 2020-07-08 NOTE — Progress Notes (Signed)
Shawna Williams - 64 y.o. female MRN [id number]  Date of birth: [birth year]  Office Visit Note: Visit Date: 07/04/2020 PCP: Kirstie Peri, MD Referred by: Kirstie Peri, MD  Subjective: Chief Complaint  Patient presents with  . Right Hand - Pain  . Left Hand - Pain   HPI:  Shawna Williams is a 64 y.o. female who comes in today at the request of Dr. Annell Greening for electrodiagnostic study of the Bilateral upper extremities.  Patient is Right hand dominant.  She reports several months of severe pain numbness and tingling in the bilateral hands right worse than left.  The numbness is predominantly in the radial digits.  It can be worse at night and positional.  She has had numbness and tingling so bad that she has gone to the ER.  Dr. Ophelia Charter has looked at her cervical spine and felt like this is probably not related to her cervical spine.  Her case is complicated by rheumatoid arthritis on Enbrel and methotrexate.  She also has type 2 diabetes.   ROS Otherwise per HPI.  Assessment & Plan: Visit Diagnoses:    ICD-10-CM   1. 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   There is no significant electrodiagnostic evidence of any other focal nerve entrapment, brachial plexopathy or cervical radiculopathy.   Recommendations: 1.  Follow-up with referring physician. 2.  Continue current management of symptoms. 3.  Continue use of resting splint at night-time and as needed during the day. 4.  Suggest surgical evaluation.   Meds & Orders: No orders of the defined types were placed in this encounter.   Orders Placed This Encounter  Procedures  . NCV with EMG (electromyography)    Follow-up: Return for Annell Greening, MD.   Procedures: No procedures performed  EMG & NCV  Findings: Evaluation of the left median motor nerve showed prolonged distal onset latency (4.3 ms) and decreased conduction velocity (Elbow-Wrist, 47 m/s).  The right median motor nerve showed prolonged distal onset latency (4.5 ms), reduced amplitude (4.9 mV), and decreased conduction velocity (Elbow-Wrist, 43 m/s).  The left median (across palm) sensory nerve showed prolonged distal peak latency (Wrist, 4.9 ms) and prolonged distal peak latency (Palm, 2.1 ms).  The right median (across palm) sensory nerve showed prolonged distal peak latency (Wrist, 4.6 ms).  All remaining nerves (as indicated in the following tables) were within normal limits.  All left vs. right side differences were within normal limits.    All examined muscles (as indicated in the following table) showed no evidence of electrical instability.    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   There is no significant electrodiagnostic evidence of any other focal nerve entrapment, brachial plexopathy or cervical radiculopathy.   Recommendations: 1.  Follow-up with referring physician. 2.  Continue current management of symptoms. 3.  Continue use of resting splint at night-time and as needed during the day. 4.  Suggest surgical evaluation.  ___________________________ Elease Hashimoto Board Certified, American Board of Physical Medicine and Rehabilitation    Nerve Conduction Studies Anti Sensory Summary Table   Stim Site NR Peak (ms) Norm Peak (ms) P-T Amp (V) Norm P-T  Amp Site1 Site2 Delta-P (ms) Dist (cm) Vel (m/s) Norm Vel (m/s)  Left Median Acr Palm Anti Sensory (2nd Digit)  32.3C  Wrist    *4.9 <3.6 16.2 >10 Wrist Palm 2.8 0.0    Palm    *2.1 <2.0 21.8         Right Median Acr Palm Anti Sensory (2nd Digit)  30.6C  Wrist    *4.6 <3.6 32.7 >10 Wrist  Palm 2.6 0.0    Palm    2.0 <2.0 21.2         Left Radial Anti Sensory (Base 1st Digit)  31.8C  Wrist    2.1 <3.1 21.8  Wrist Base 1st Digit 2.1 0.0    Right Radial Anti Sensory (Base 1st Digit)  30.9C  Wrist    2.1 <3.1 25.2  Wrist Base 1st Digit 2.1 0.0    Left Ulnar Anti Sensory (5th Digit)  32.7C  Wrist    3.4 <3.7 19.7 >15.0 Wrist 5th Digit 3.4 14.0 41 >38  Right Ulnar Anti Sensory (5th Digit)  31.5C  Wrist    3.4 <3.7 18.8 >15.0 Wrist 5th Digit 3.4 14.0 41 >38   Motor Summary Table   Stim Site NR Onset (ms) Norm Onset (ms) O-P Amp (mV) Norm O-P Amp Site1 Site2 Delta-0 (ms) Dist (cm) Vel (m/s) Norm Vel (m/s)  Left Median Motor (Abd Poll Brev)  32.5C  Wrist    *4.3 <4.2 5.0 >5 Elbow Wrist 4.7 22.3 *47 >50  Elbow    9.0  4.9         Right Median Motor (Abd Poll Brev)  31.5C  Wrist    *4.5 <4.2 *4.9 >5 Elbow Wrist 5.3 23.0 *43 >50  Elbow    9.8  4.8         Left Ulnar Motor (Abd Dig Min)  32.7C  Wrist    2.8 <4.2 7.6 >3 B Elbow Wrist 3.2 20.5 64 >53  B Elbow    6.0  7.3  A Elbow B Elbow 1.6 10.0 63 >53  A Elbow    7.6  6.9         Right Ulnar Motor (Abd Dig Min)  32C  Wrist    2.9 <4.2 6.9 >3 B Elbow Wrist 3.0 20.0 67 >53  B Elbow    5.9  7.0  A Elbow B Elbow 1.4 10.0 71 >53  A Elbow    7.3  6.7          EMG   Side Muscle Nerve Root Ins Act Fibs Psw Amp Dur Poly Recrt Int Dennie Bible Comment  Right Abd Poll Brev Median C8-T1 Nml Nml Nml Nml Nml 0 Nml Nml   Right 1stDorInt Ulnar C8-T1 Nml Nml Nml Nml Nml 0 Nml Nml   Left Abd Poll Brev Median C8-T1 Nml Nml Nml Nml Nml 0 Nml Nml   Left 1stDorInt Ulnar C8-T1 Nml Nml Nml Nml Nml 0 Nml Nml     Nerve Conduction Studies Anti Sensory Left/Right Comparison   Stim Site L Lat (ms) R Lat (ms) L-R Lat (ms) L Amp (V) R Amp (V) L-R Amp (%) Site1 Site2 L Vel (m/s) R Vel (m/s) L-R Vel (m/s)  Median Acr Palm Anti Sensory (2nd Digit)  32.3C  Wrist *4.9 *4.6 0.3 16.2 32.7 50.5 Wrist Palm     Palm *2.1 2.0 0.1 21.8 21.2 2.8       Radial  Anti Sensory (Base 1st Digit)  31.8C  Wrist 2.1 2.1  0.0 21.8 25.2 13.5 Wrist Base 1st Digit     Ulnar Anti Sensory (5th Digit)  32.7C  Wrist 3.4 3.4 0.0 19.7 18.8 4.6 Wrist 5th Digit 41 41 0   Motor Left/Right Comparison   Stim Site L Lat (ms) R Lat (ms) L-R Lat (ms) L Amp (mV) R Amp (mV) L-R Amp (%) Site1 Site2 L Vel (m/s) R Vel (m/s) L-R Vel (m/s)  Median Motor (Abd Poll Brev)  32.5C  Wrist *4.3 *4.5 0.2 5.0 *4.9 2.0 Elbow Wrist *47 *43 4  Elbow 9.0 9.8 0.8 4.9 4.8 2.0       Ulnar Motor (Abd Dig Min)  32.7C  Wrist 2.8 2.9 0.1 7.6 6.9 9.2 B Elbow Wrist 64 67 3  B Elbow 6.0 5.9 0.1 7.3 7.0 4.1 A Elbow B Elbow 63 71 8  A Elbow 7.6 7.3 0.3 6.9 6.7 2.9          Waveforms:                      Clinical History: No specialty comments available.     Objective:  VS:  HT:    WT:   BMI:     BP:   HR: bpm  TEMP: ( )  RESP:  Physical Exam Constitutional:      Appearance: She is obese.  Musculoskeletal:        General: No swelling, tenderness or deformity.     Comments: Inspection reveals no atrophy of the bilateral APB or FDI or hand intrinsics. There is no swelling, color changes, allodynia or dystrophic changes. There is 5 out of 5 strength in the bilateral wrist extension, finger abduction and long finger flexion. There is intact sensation to light touch in all dermatomal and peripheral nerve distributions. There is a positive Phalen's test bilaterally. There is a negative Hoffmann's test bilaterally.  Skin:    General: Skin is warm and dry.     Findings: No erythema or rash.  Neurological:     General: No focal deficit present.     Mental Status: She is alert and oriented to person, place, and time.     Sensory: No sensory deficit.     Motor: No weakness or abnormal muscle tone.     Coordination: Coordination normal.     Gait: Gait normal.  Psychiatric:        Mood and Affect: Mood normal.        Behavior: Behavior normal.      Imaging: No results found.

## 2020-07-15 ENCOUNTER — Ambulatory Visit: Payer: BC Managed Care – PPO | Admitting: Orthopaedic Surgery

## 2020-07-15 ENCOUNTER — Other Ambulatory Visit: Payer: Self-pay

## 2020-07-15 ENCOUNTER — Encounter: Payer: Self-pay | Admitting: Orthopaedic Surgery

## 2020-07-15 DIAGNOSIS — G5601 Carpal tunnel syndrome, right upper limb: Secondary | ICD-10-CM

## 2020-07-15 DIAGNOSIS — G5602 Carpal tunnel syndrome, left upper limb: Secondary | ICD-10-CM

## 2020-07-16 DIAGNOSIS — G5602 Carpal tunnel syndrome, left upper limb: Secondary | ICD-10-CM | POA: Insufficient documentation

## 2020-07-16 DIAGNOSIS — G5601 Carpal tunnel syndrome, right upper limb: Secondary | ICD-10-CM | POA: Insufficient documentation

## 2020-07-16 NOTE — Progress Notes (Signed)
Office Visit Note   Patient: Shawna Williams           Date of Birth: 1956-07-05           MRN: [id number] Visit Date: 07/15/2020              Requested by: Kirstie Peri, MD [address] [place],  [place] [address] PCP: Kirstie Peri, MD   Assessment & Plan: Visit Diagnoses:  1. Carpal tunnel syndrome, right upper limb   2. Carpal tunnel syndrome, left upper limb     Plan: We discussed using some inserts that will help support the arch of her foot decreased posterior tibial tendon stress.  She is able to toe walk but does have pain.  She would like to proceed with right carpal tunnel release this to be done as an outpatient.  Procedure discussed she could decide on the timing of the opposite left hand after having first 1 done and we discussed the typical timing for most patients waiting a few months.  Procedure discussed.  Questions elicited and answered decision for surgery made.  Follow-Up Instructions: No follow-ups on file.   Orders:  No orders of the defined types were placed in this encounter.  No orders of the defined types were placed in this encounter.     Procedures: No procedures performed   Clinical Data: No additional findings.   Subjective: Chief Complaint  Patient presents with  . Left Hand - Follow-up, Numbness    EMG/NCS review  . Right Hand - Follow-up, Numbness    EMG/NCS review    HPI patient returns with ongoing problems with right hand numbness more than left.  It wakes her up at night she is used splints at night which helped somewhat.  She drops objects.  She has problems going back to sleep she has to shake her wrist.  Lateral studies have been performed which shows moderate bilateral carpal tunnel syndrome. 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  Patient states she is ready to proceed with carpal tunnel release due to her ongoing symptoms. Review of Systems positive for bilateral foot pain related to her osteoarthritis.  All other  systems are noncontributory to HPI.   Objective: Vital Signs: Ht 5\' 5"  (1.651 m)   Wt 214 lb (97.1 kg)   BMI 35.61 kg/m   Physical Exam Constitutional:      Appearance: She is well-developed.  HENT:     Head: Normocephalic.     Right Ear: External ear normal.     Left Ear: External ear normal.  Eyes:     Pupils: Pupils are equal, round, and reactive to light.  Neck:     Thyroid: No thyromegaly.     Trachea: No tracheal deviation.  Cardiovascular:     Rate and Rhythm: Normal rate.  Pulmonary:     Effort: Pulmonary effort is normal.  Abdominal:     Palpations: Abdomen is soft.  Skin:    General: Skin is warm and dry.  Neurological:     Mental Status: She is alert and oriented to person, place, and time.  Psychiatric:        Mood and Affect: Mood and affect normal.        Behavior: Behavior normal.     Ortho Exam patient has positive Phalen's positive Tinel's at the wrist right and left.  Slight thenar weakness.  No brachial plexus tenderness good cervical range of motion.  There is some mild tenderness of the posterior  tibial tendon on the left foot.  Mid midfoot dorsal osteophytes.  Specialty Comments:  No specialty comments available.  Imaging: EMG & NCV Findings: Evaluation of the left median motor nerve showed prolonged distal onset latency (4.3 ms) and decreased conduction velocity (Elbow-Wrist, 47 m/s).  The right median motor nerve showed prolonged distal onset latency (4.5 ms), reduced amplitude (4.9 mV), and decreased conduction velocity (Elbow-Wrist, 43 m/s).  The left median (across palm) sensory nerve showed prolonged distal peak latency (Wrist, 4.9 ms) and prolonged distal peak latency (Palm, 2.1 ms).  The right median (across palm) sensory nerve showed prolonged distal peak latency (Wrist, 4.6 ms).  All remaining nerves (as indicated in the following tables) were within normal limits.  All left vs. right side differences were within normal limits.    All  examined muscles (as indicated in the following table) showed no evidence of electrical instability.    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   There is no significant electrodiagnostic evidence of any other focal nerve entrapment, brachial plexopathy or cervical radiculopathy.   Recommendations: 1.  Follow-up with referring physician. 2.  Continue current management of symptoms. 3.  Continue use of resting splint at night-time and as needed during the day. 4.  Suggest surgical evaluation.  ___________________________ Naaman Plummer FAAPMR Board Certified, American Board of Physical Medicine and Rehabilitation     Nerve Conduction Studies Anti Sensory Summary Table   Stim Site NR Peak (ms) Norm Peak (ms) P-T Amp (V) Norm P-T Amp Site1 Site2 Delta-P (ms) Dist (cm) Vel (m/s) Norm Vel (m/s)  Left Median Acr Palm Anti Sensory (2nd Digit)  32.3C  Wrist    *4.9 <3.6 16.2 >10 Wrist Palm 2.8 0.0    Palm    *2.1 <2.0 21.8         Right Median Acr Palm Anti Sensory (2nd Digit)  30.6C  Wrist    *4.6 <3.6 32.7 >10 Wrist Palm 2.6 0.0    Palm    2.0 <2.0 21.2         Left Radial Anti Sensory (Base 1st Digit)  31.8C  Wrist    2.1 <3.1 21.8  Wrist Base 1st Digit 2.1 0.0    Right Radial Anti Sensory (Base 1st Digit)  30.9C  Wrist    2.1 <3.1 25.2  Wrist Base 1st Digit 2.1 0.0    Left Ulnar Anti Sensory (5th Digit)  32.7C  Wrist    3.4 <3.7 19.7 >15.0 Wrist 5th Digit 3.4 14.0 41 >38  Right Ulnar Anti Sensory (5th Digit)  31.5C  Wrist    3.4 <3.7 18.8 >15.0 Wrist 5th Digit 3.4 14.0 41 >38   Motor Summary Table   Stim Site NR Onset (ms) Norm Onset (ms) O-P Amp (mV) Norm O-P Amp Site1 Site2 Delta-0 (ms) Dist (cm) Vel (m/s) Norm Vel (m/s)  Left Median Motor (Abd Poll Brev)  32.5C  Wrist    *4.3  <4.2 5.0 >5 Elbow Wrist 4.7 22.3 *47 >50  Elbow    9.0  4.9         Right Median Motor (Abd Poll Brev)  31.5C  Wrist    *4.5 <4.2 *4.9 >5 Elbow Wrist 5.3 23.0 *43 >50  Elbow    9.8  4.8         Left Ulnar Motor (Abd Dig Min)  32.7C  Wrist  2.8 <4.2 7.6 >3 B Elbow Wrist 3.2 20.5 64 >53  B Elbow    6.0  7.3  A Elbow B Elbow 1.6 10.0 63 >53  A Elbow    7.6  6.9         Right Ulnar Motor (Abd Dig Min)  32C  Wrist    2.9 <4.2 6.9 >3 B Elbow Wrist 3.0 20.0 67 >53  B Elbow    5.9  7.0  A Elbow B Elbow 1.4 10.0 71 >53  A Elbow    7.3  6.7          EMG   Side Muscle Nerve Root Ins Act Fibs Psw Amp Dur Poly Recrt Int Dennie Bible Comment  Right Abd Poll Brev Median C8-T1 Nml Nml Nml Nml Nml 0 Nml Nml   Right 1stDorInt Ulnar C8-T1 Nml Nml Nml Nml Nml 0 Nml Nml   Left Abd Poll Brev Median C8-T1 Nml Nml Nml Nml Nml 0 Nml Nml   Left 1stDorInt Ulnar C8-T1 Nml Nml Nml Nml Nml 0 Nml Nml     Nerve Conduction Studies Anti Sensory Left/Right Comparison   Stim Site L Lat (ms) R Lat (ms) L-R Lat (ms) L Amp (V) R Amp (V) L-R Amp (%) Site1 Site2 L Vel (m/s) R Vel (m/s) L-R Vel (m/s)  Median Acr Palm Anti Sensory (2nd Digit)  32.3C  Wrist *4.9 *4.6 0.3 16.2 32.7 50.5 Wrist Palm     Palm *2.1 2.0 0.1 21.8 21.2 2.8       Radial Anti Sensory (Base 1st Digit)  31.8C  Wrist 2.1 2.1 0.0 21.8 25.2 13.5 Wrist Base 1st Digit     Ulnar Anti Sensory (5th Digit)  32.7C  Wrist 3.4 3.4 0.0 19.7 18.8 4.6 Wrist 5th Digit 41 41 0   Motor Left/Right Comparison   Stim Site L Lat (ms) R Lat (ms) L-R Lat (ms) L Amp (mV) R Amp (mV) L-R Amp (%) Site1 Site2 L Vel (m/s) R Vel (m/s) L-R Vel (m/s)  Median Motor (Abd Poll Brev)  32.5C  Wrist *4.3 *4.5 0.2 5.0 *4.9 2.0 Elbow Wrist *47 *43 4  Elbow 9.0 9.8 0.8 4.9 4.8 2.0       Ulnar Motor (Abd Dig Min)  32.7C  Wrist 2.8 2.9 0.1 7.6 6.9 9.2 B Elbow Wrist 64 67 3  B Elbow 6.0 5.9 0.1 7.3 7.0 4.1 A Elbow B Elbow 63  71 8  A Elbow 7.6 7.3 0.3 6.9 6.7 2.9           Waveforms:                     Imaging  Imaging Information      PMFS History: Patient Active Problem List   Diagnosis Date Noted  . Carpal tunnel syndrome, right upper limb 07/16/2020  . Carpal tunnel syndrome, left upper limb 07/16/2020  . Bilateral carpal tunnel syndrome 05/20/2020  . Cholecystitis, acute   . Calculus of gallbladder 05/16/2018  . 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 Primary osteoarthritis of both hands 06/28/2016  . Primary osteoarthritis of both feet 06/28/2016  . 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 High risk medication use 06/03/2016  . Myofascial muscle pain 06/03/2016  . Other fatigue 06/03/2016  . Other insomnia 06/03/2016  . Chronic pain syndrome 06/03/2016   Past Medical History:  Diagnosis Date  . Arthritis    rhemuatoid  . Asthma    history of  asthmas with a persistan cough  . Hyperlipemia   . Hypertension   . Major depressive disorder   . 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 Type 2 diabetes mellitus (HCC)    controlled by diet    Family History  Problem Relation Age of Onset  . Stroke Mother   . Diabetes Mother   . Heart attack Father   . Cancer Sister   . Diabetes Brother   . Diabetes Sister   . Heart attack Sister     Past Surgical History:  Procedure Laterality Date  . CESAREAN SECTION  1981 and 1985  . CHOLECYSTECTOMY N/A 05/18/2018   Procedure: LAPAROSCOPIC CHOLECYSTECTOMY;  Surgeon: Lucretia Roers, MD;  Location: [facility];  Service: General;  Laterality: N/A;  . COLONOSCOPY N/A 01/01/2016   Procedure: COLONOSCOPY;  Surgeon: Malissa Hippo, MD;  Location: [facility];  Service: Endoscopy;  Laterality: N/A;  930  . FOOT SURGERY Left    with screws  . TONSILLECTOMY     Social History   Occupational History  . Not on file  Tobacco Use  . Smoking status: Never Smoker  . Smokeless tobacco: Never Used   Vaping Use  . Vaping Use: Never used  Substance and Sexual Activity  . Alcohol use: Not Currently    Comment: rarely  . Drug use: No  . Sexual activity: Not on file

## 2020-09-01 ENCOUNTER — Other Ambulatory Visit: Payer: Self-pay | Admitting: Orthopaedic Surgery

## 2020-09-01 DIAGNOSIS — G5601 Carpal tunnel syndrome, right upper limb: Secondary | ICD-10-CM

## 2020-09-01 MED ORDER — HYDROCODONE-ACETAMINOPHEN 5-325 MG PO TABS: 1.0000 | ORAL_TABLET | Freq: Four times a day (QID) | ORAL | 0 refills | Status: DC | PRN

## 2020-09-01 NOTE — Progress Notes (Signed)
norco post right CTR pain. # 30

## 2020-09-02 ENCOUNTER — Telehealth: Payer: Self-pay | Admitting: Orthopaedic Surgery

## 2020-09-02 NOTE — Telephone Encounter (Signed)
Ok

## 2020-09-02 NOTE — Telephone Encounter (Signed)
I called done. OK for 24th

## 2020-09-02 NOTE — Telephone Encounter (Signed)
Patient had right carpal tunnel release 09-01-20 at [facility] with Dr. Ophelia Charter.  Patient has follow up in [place] on 09-11-20.  Patient's spouse [name] called and said Dr. Ophelia Charter told them to come back for follow up in one week.  I offered an appointment here in [facility] Tuesday but he would like to keep the appointment in [place] on 24th if this is not too far out for a follow up.  [name] states she is doing ok and the incision seems ok, she just has a little bit of swelling. He is asking if this is normal.    cb  [phone]

## 2020-09-11 ENCOUNTER — Ambulatory Visit (INDEPENDENT_AMBULATORY_CARE_PROVIDER_SITE_OTHER): Payer: BC Managed Care – PPO | Admitting: Orthopaedic Surgery

## 2020-09-11 ENCOUNTER — Encounter: Payer: Self-pay | Admitting: Orthopaedic Surgery

## 2020-09-11 ENCOUNTER — Other Ambulatory Visit: Payer: Self-pay

## 2020-09-11 VITALS — Ht 65.0 in | Wt 214.0 lb

## 2020-09-11 DIAGNOSIS — G5603 Carpal tunnel syndrome, bilateral upper limbs: Secondary | ICD-10-CM

## 2020-09-11 NOTE — Progress Notes (Signed)
Postop right carpal tunnel release.  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  Incision looks good she will return next week for nurse visit only for suture removal and Steri-Strips.  She can take the Steri-Strips off a week after they are applied if they have not peeled off on their own.  She can remove the splint now for taking a shower washing her hair and will gradually increase use of her hand with daily activities.  She will call when she is ready to schedule the left carpal tunnel release and we will schedule it exactly like the right carpal tunnel release was performed.

## 2020-09-15 ENCOUNTER — Telehealth: Payer: Self-pay | Admitting: Radiology

## 2020-09-15 ENCOUNTER — Other Ambulatory Visit: Payer: Self-pay

## 2020-09-15 ENCOUNTER — Ambulatory Visit (HOSPITAL_COMMUNITY)
Admission: RE | Admit: 2020-09-15 | Discharge: 2020-09-15 | Disposition: A | Discharge status: Home / Self Care | Payer: BC Managed Care – PPO | Source: Ambulatory Visit | Attending: Orthopaedic Surgery | Admitting: Orthopaedic Surgery

## 2020-09-15 DIAGNOSIS — M79605 Pain in left leg: Secondary | ICD-10-CM

## 2020-09-15 DIAGNOSIS — M7989 Other specified soft tissue disorders: Secondary | ICD-10-CM

## 2020-09-15 NOTE — Telephone Encounter (Signed)
Patient called Shawna Williams office and states doppler was negative for DVT. Left leg is extremely swollen and painful. She went to bed on Friday night fine, and woke up with left leg like this on Saturday morning. Carlon advised to go home, elevate and use ice as needed. Any other instructions?    Per Dr. Ophelia Charter, can work into schedule tomorrow. I called patient and advised. She will come in at 1000 tomorrow morning.

## 2020-09-15 NOTE — Telephone Encounter (Signed)
Patient being seen in [place] office to have sutures removed post carpal tunnel release on 09/01/2020.  She woke up with extreme left lower leg pain and swelling in her left leg and foot. Per Dr. .Ophelia Charter, order [place] to R/O DVT. Patient would like to have done at [facility]. Order entered into system. Shawna Williams will schedule with Jeani Hawking. Put in as call report to Dr. Ophelia Charter at [phone].

## 2020-09-16 ENCOUNTER — Encounter: Payer: Self-pay | Admitting: Orthopaedic Surgery

## 2020-09-16 ENCOUNTER — Ambulatory Visit (INDEPENDENT_AMBULATORY_CARE_PROVIDER_SITE_OTHER): Payer: BC Managed Care – PPO

## 2020-09-16 ENCOUNTER — Ambulatory Visit: Payer: BC Managed Care – PPO | Admitting: Orthopaedic Surgery

## 2020-09-16 VITALS — BP 128/72 | HR 95 | Ht 65.0 in | Wt 214.0 lb

## 2020-09-16 DIAGNOSIS — M25562 Pain in left knee: Secondary | ICD-10-CM

## 2020-09-16 DIAGNOSIS — M7632 Iliotibial band syndrome, left leg: Secondary | ICD-10-CM | POA: Insufficient documentation

## 2020-09-16 NOTE — Progress Notes (Signed)
Post-Op Visit Note   Patient: Shawna Williams           Date of Birth: [birth year]           MRN: [id number] Visit Date: 09/16/2020 PCP: Kirstie Peri, MD   Assessment & Plan: Patient woke up with sudden increased pain in her left leg swelling in the left lower extremity had a Doppler test on urgent basis which was negative for DVT.  She points laterally over the condyle where she is having pain.  No knee effusion pain with flexion extension.  She did recall she had help push the dog out of the way when she was in the bed but does not recall contacting her knee with any hard objects etc.  Exam demonstrates exquisite tenderness over the lateral femoral condyle with pain flexion-extension of iliotibial band.  Chief Complaint:  Chief Complaint  Patient presents with  . Left Leg - Pain   Visit Diagnoses:  1. Acute pain of left knee   2. 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  X-rays were negative for acute changes she does have some small lateral osteophytes in the left knee.  She is not better she can come back in a few weeks we can consider an injection.  She will apply Aspercreme for times a day.  Carpal tunnel right hand doing well she is waiting till summertime for the left carpal tunnel release.  Follow-Up Instructions: No follow-ups on file.   Orders:  Orders Placed This Encounter  Procedures  . XR KNEE 3 VIEW LEFT   No orders of the defined types were placed in this encounter.   Imaging: XR KNEE 3 VIEW LEFT  Result Date: 09/16/2020 Standing [facility] both knees lateral left knee obtained and reviewed.  This shows small osteophytes laterally on the left knee and medial on the right knee.  Mild joint narrowing.  Negative for acute changes.  No knee effusion noted. Impression: Mild marginal osteophyte right left knee as described above.   PMFS History: Patient Active Problem List   Diagnosis Date Noted  . Iliotibial band  syndrome affecting left lower leg 09/16/2020  . Carpal tunnel syndrome, right upper limb 07/16/2020  . Carpal tunnel syndrome, left upper limb 07/16/2020  . Bilateral carpal tunnel syndrome 05/20/2020  . Cholecystitis, acute   . Calculus of gallbladder 05/16/2018  . 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 Primary osteoarthritis of both hands 06/28/2016  . Primary osteoarthritis of both feet 06/28/2016  . 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 High risk medication use 06/03/2016  . Myofascial muscle pain 06/03/2016  . Other fatigue 06/03/2016  . Other insomnia 06/03/2016  . Chronic pain syndrome 06/03/2016   Past Medical History:  Diagnosis Date  . Arthritis    rhemuatoid  . Asthma    history of asthmas with a persistan cough  . Hyperlipemia   . Hypertension   . Major depressive disorder   . 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 Type 2 diabetes mellitus (HCC)    controlled by diet    Family History  Problem Relation Age of Onset  . Stroke Mother   . Diabetes Mother   . Heart attack Father   . Cancer Sister   . Diabetes Brother   . Diabetes Sister   . Heart attack Sister     Past Surgical History:  Procedure Laterality Date  . CESAREAN SECTION  1981  and 1985  . CHOLECYSTECTOMY N/A 05/18/2018   Procedure: LAPAROSCOPIC CHOLECYSTECTOMY;  Surgeon: Lucretia Roers, MD;  Location: [facility];  Service: General;  Laterality: N/A;  . COLONOSCOPY N/A 01/01/2016   Procedure: COLONOSCOPY;  Surgeon: Malissa Hippo, MD;  Location: [facility];  Service: Endoscopy;  Laterality: N/A;  930  . FOOT SURGERY Left    with screws  . TONSILLECTOMY     Social History   Occupational History  . Not on file  Tobacco Use  . Smoking status: Never Smoker  . Smokeless tobacco: Never Used  Vaping Use  . Vaping Use: Never used  Substance and Sexual Activity  . Alcohol use: Not Currently    Comment: rarely  . Drug use: No  . Sexual activity: Not on file

## 2020-09-26 ENCOUNTER — Ambulatory Visit: Payer: BC Managed Care – PPO | Admitting: Internal Medicine

## 2020-09-26 ENCOUNTER — Other Ambulatory Visit: Payer: Self-pay

## 2020-09-26 ENCOUNTER — Ambulatory Visit (HOSPITAL_COMMUNITY)
Admission: RE | Admit: 2020-09-26 | Discharge: 2020-09-26 | Disposition: A | Discharge status: Home / Self Care | Payer: BC Managed Care – PPO | Source: Ambulatory Visit | Attending: Internal Medicine | Admitting: Internal Medicine

## 2020-09-26 ENCOUNTER — Encounter: Payer: Self-pay | Admitting: Internal Medicine

## 2020-09-26 DIAGNOSIS — J449 Chronic obstructive pulmonary disease, unspecified: Secondary | ICD-10-CM

## 2020-09-26 DIAGNOSIS — J4489 Other specified chronic obstructive pulmonary disease: Secondary | ICD-10-CM | POA: Insufficient documentation

## 2020-09-26 MED ORDER — PREDNISONE 10 MG PO TABS: ORAL_TABLET | ORAL | 0 refills | Status: DC

## 2020-09-26 MED ORDER — BUDESONIDE-FORMOTEROL FUMARATE 80-4.5 MCG/ACT IN AERO: INHALATION_SPRAY | RESPIRATORY_TRACT | 11 refills | Status: DC

## 2020-09-26 MED ORDER — ALBUTEROL SULFATE HFA 108 (90 BASE) MCG/ACT IN AERS: 1.0000 | INHALATION_SPRAY | RESPIRATORY_TRACT | 1 refills | Status: DC | PRN

## 2020-09-26 MED ORDER — AZITHROMYCIN 250 MG PO TABS: ORAL_TABLET | ORAL | 0 refills | Status: DC

## 2020-09-26 NOTE — Progress Notes (Signed)
Shawna Williams, female    DOB: Nov 04, 1955     MRN: [id number]   Brief patient profile:  3 yowf never smoker h/o allergies as child no asthma and no problems as adult despite dx of RA in 1994 f/b [place] on MTX since 1998 but then onset sob/cough around 2018 eval by Juanetta Gosling 11/22/2017 with POS MCT > Breo 100 but taken seasonally not daily self referred  Back  to pulmonary clinic in [place]  09/26/2020       History of Present Illness  09/26/2020  Pulmonary/ 1st office eval/ Shawna Williams / [place] [facility]  Chief Complaint  Patient presents with  . Consult    Former Dr Juanetta Gosling patient- intermittent productive cough with green phlegm since November 2021  Dyspnea:  Yardwork, swim but uphill walking a problem and  not sure BREO helps  Then flares tend to occur late fall/winter into spring this one occurred in nov 2021 rx again BREO rx pred by RA PA x 10 days temporally  Cough: worse first thing in am  Sleep: ok once asleep bed is flat two pillows does fine  SABA use: up to 2-3 x day     No obvious day to day or daytime variability or assoc   mucus plugs or hemoptysis or cp or chest tightness, subjective wheeze or overt sinus or hb symptoms.    Also denies any obvious fluctuation of symptoms with weather or environmental changes or other aggravating or alleviating factors except as outlined above   No unusual exposure hx or h/o childhood pna/ asthma or knowledge of premature birth.  Current Allergies, Complete Past Medical History, Past Surgical History, Family History, and Social History were reviewed in [employer] record.  ROS  The following are not active complaints unless bolded Hoarseness, sore throat, dysphagia, dental problems, itching, sneezing,  nasal congestion or discharge of excess mucus or purulent secretions, ear ache,   fever, chills, sweats, unintended wt loss or wt gain, classically pleuritic or exertional cp,  orthopnea pnd or arm/hand swelling  or leg  swelling, presyncope, palpitations, abdominal pain, anorexia, nausea, vomiting, diarrhea  or change in bowel habits or change in bladder habits, change in stools or change in urine, dysuria, hematuria,  rash, arthralgias, visual complaints, headache, numbness, weakness or ataxia or problems with walking or coordination,  change in mood or  memory.             Past Medical History:  Diagnosis Date  . Arthritis    rhemuatoid  . Asthma    history of asthmas with a persistan cough  . Hyperlipemia   . Hypertension   . Major depressive disorder   . 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 Type 2 diabetes mellitus (HCC)    controlled by diet    Outpatient Medications Prior to Visit  Medication Sig Dispense Refill  . aspirin EC 81 MG tablet Take 1 tablet (81 mg total) by mouth daily.    . cholecalciferol (VITAMIN D3) 25 MCG (1000 UNIT) tablet Take 1,000 Units by mouth daily.    . Cinnamon 500 MG capsule Take 1,000 mg by mouth daily.    . diclofenac sodium (VOLTAREN) 1 % GEL Voltaren Gel 3 grams to 3 large joints upto TID 3 TUBES with 3 refills (Patient taking differently: as needed. Voltaren Gel 3 grams to 3 large joints upto TID 3 TUBES with 3 refills) 3 Tube 3  . folic acid (FOLVITE) 1 MG tablet Take 1 mg by mouth  daily.    Marland Kitchen LORazepam (ATIVAN) 0.5 MG tablet Take 0.5 mg by mouth daily as needed.    Marland Kitchen losartan-hydrochlorothiazide (HYZAAR) 100-25 MG tablet Take 1 tablet by mouth daily.    . methotrexate 50 MG/2ML injection INJECT 0.8 MLS INTO THE SKIN ONCE A WEEK (Patient taking differently: Inject 60 mg into the skin every Sunday.) 4 mL 2  . Multiple Vitamin (MULTIVITAMIN) tablet Take 1 tablet by mouth daily.    . Multiple Vitamins-Minerals (ICAPS AREDS 2 PO) Take 1 capsule by mouth 2 (two) times daily.     Marland Kitchen omeprazole (PRILOSEC) 20 MG capsule Take 20 mg by mouth daily.    Marland Kitchen ORENCIA CLICKJECT 125 MG/ML SOAJ Inject 1 Syringe into the skin once a week.    . rosuvastatin (CRESTOR) 20 MG tablet  Take 1 tablet (20 mg total) by mouth daily. 90 tablet 3  . topiramate (TOPAMAX) 25 MG capsule Take 25 mg by mouth daily.    . traMADol (ULTRAM) 50 MG tablet Take 1 tablet (50 mg total) by mouth at bedtime. (Patient taking differently: Take 50 mg by mouth every 12 (twelve) hours as needed for moderate pain.) 30 tablet 0  . traZODone (DESYREL) 50 MG tablet TAKE 1 TABLET BY MOUTH AT BEDTIME (Patient taking differently: Take 50 mg by mouth at bedtime as needed for sleep.) 30 tablet 0  . venlafaxine XR (EFFEXOR-XR) 75 MG 24 hr capsule Take 75 mg by mouth daily.  3  . albuterol (VENTOLIN HFA) 108 (90 Base) MCG/ACT inhaler Inhale 1-2 puffs into the lungs every 4 (four) hours as needed for wheezing or shortness of breath.    Marland Kitchen HYDROcodone-acetaminophen (NORCO/VICODIN) 5-325 MG tablet Take 1-2 tablets by mouth every 6 (six) hours as needed for moderate pain. (Patient not taking: Reported on 09/26/2020) 30 tablet 0  . Lidocaine (HM LIDOCAINE PATCH) 4 % PTCH Apply 1 application topically daily as needed. (Patient not taking: Reported on 09/26/2020) 30 patch 0   No facility-administered medications prior to visit.     Objective:     BP (!) 142/92 (BP Location: Left Arm, Cuff Size: Normal)   Pulse 93   Temp 97.7 F (36.5 C) (Other (Comment)) Comment (Src): wrist  Ht 5\' 5"  (1.651 m)   Wt 215 lb (97.5 kg)   SpO2 96% Comment: Room air  BMI 35.78 kg/m   SpO2: 96 % (Room air)   Very pleasant amb wf congested sounding cough   HEENT : pt wearing mask not removed for exam due to covid -19 concerns.    NECK :  without JVD/Nodes/TM/ nl carotid upstrokes bilaterally   LUNGS: no acc muscle use,  Nl contour chest with sonorous insp/exp rhonchi  bilaterally without cough on insp or exp maneuvers   CV:  RRR  no s3 or murmur or increase in P2, and no edema   ABD:  soft and nontender with nl inspiratory excursion in the supine position. No bruits or organomegaly appreciated, bowel sounds nl  MS:  Nl gait/  ext warm without deformities, calf tenderness, cyanosis or clubbing No obvious joint restrictions   SKIN: warm and dry without lesions    NEURO:  alert, approp, nl sensorium with  no motor or cerebellar deficits apparent.    I personally reviewed images and agree with radiology impression as follows:  Chest CT cuts on CT coronary study 10/18/19 Small hiatal hernia. Otherwise no significant extracardiac findings.  CXR PA and Lateral:   09/26/2020 :    I  personally reviewed images and   impression as follows:   Nl lung volumes, no significant ild      Assessment   Asthmatic bronchitis , chronic (HCC) Never smoker  - PFTs nl 2019 x for POS MCT  - Trial of symbicort 80 2bid 09/26/2020 >>>   DDX of  difficult airways management almost all start with A and  include Adherence, Ace Inhibitors, Acid Reflux, Active Sinus Disease, Alpha 1 Antitripsin deficiency, Anxiety masquerading as Airways dz,  ABPA,  Allergy(esp in young), Aspiration (esp in elderly), Adverse effects of meds,  Active smoking or vaping, A bunch of PE's (a small clot burden can't cause this syndrome unless there is already severe underlying pulm or vascular dz with poor reserve) plus two Bs  = Bronchiectasis and Beta blocker use..and one C= CHF    Adherence is always the initial "prime suspect" and is a multilayered concern that requires a "trust but verify" approach in every patient - starting with knowing how to use medications, especially inhalers, correctly, keeping up with refills and understanding the fundamental difference between maintenance and prns vs those medications only taken for a very short course and then stopped and not refilled.  - - The proper method of use, as well as anticipated side effects, of a metered-dose inhaler were discussed and demonstrated to the patient using teach back method.   - return with all meds in hand using a trust but verify approach to confirm accurate Medication  Reconciliation The  principal here is that until we are certain that the  patients are doing what we've asked, it makes no sense to ask them to do more.   ? Acid (or non-acid) GERD > always difficult to exclude as up to 75% of pts in some series report no assoc GI/ Heartburn symptoms> rec continue max (24h)  acid suppression and diet restrictions/ reviewed     ? Allergy/ airways inflammation   > Prednisone 10 mg take  4 each am x 2 days,   2 each am x 2 days,  1 each am x 2 days and stop and symb 80 2bid  And judicious saba: I spent extra time with pt today reviewing appropriate use of albuterol for prn use on exertion with the following points: 1) saba is for relief of sob that does not improve by walking a slower pace or resting but rather if the pt does not improve after trying this first. 2) If the pt is convinced, as many are, that saba helps recover from activity faster then it's easy to tell if this is the case by re-challenging : ie stop, take the inhaler, then p 5 minutes try the exact same activity (intensity of workload) that just caused the symptoms and see if they are substantially diminished or not after saba 3) if there is an activity that reproducibly causes the symptoms, try the saba 15 min before the activity on alternate days   If in fact the saba really does help, then fine to continue to use it prn but advised may need to look closer at the maintenance regimen being used to achieve better control of airways disease with exertion.   ? Active sinus dz/ Rhinitis/bronchitis > rx zpak to see if mucus clears and if not may need sinus ct   ? adverse drug effects > concerned about MTX but cxr ok > no change rx   ? Anxiety/depression  > usually at the bottom of this list of usual suspects but  note already on psychotropics and may interfere with adherence and also interpretation of response or lack thereof to symptom management which can be quite subjective.   ? Bronchiectasis > risk as has RA but nothing  to suggest on cxr > consider HRCT chest if can't control symptoms with conservative rx as outlined           Each maintenance medication was reviewed in detail including emphasizing most importantly the difference between maintenance and prns and under what circumstances the prns are to be triggered using an action plan format where appropriate.  Total time for H and P, chart review, counseling, reviewing hfa device(s) and generating customized AVS unique to this office visit / same day charting = 51 min           Sandrea Hughs, MD 09/26/2020

## 2020-09-26 NOTE — Assessment & Plan Note (Addendum)
Never smoker  - PFTs nl 2019 x for POS MCT  - Trial of symbicort 80 2bid 09/26/2020 >>>   DDX of  difficult airways management almost all start with A and  include Adherence, Ace Inhibitors, Acid Reflux, Active Sinus Disease, Alpha 1 Antitripsin deficiency, Anxiety masquerading as Airways dz,  ABPA,  Allergy(esp in young), Aspiration (esp in elderly), Adverse effects of meds,  Active smoking or vaping, A bunch of PE's (a small clot burden can't cause this syndrome unless there is already severe underlying pulm or vascular dz with poor reserve) plus two Bs  = Bronchiectasis and Beta blocker use..and one C= CHF    Adherence is always the initial "prime suspect" and is a multilayered concern that requires a "trust but verify" approach in every patient - starting with knowing how to use medications, especially inhalers, correctly, keeping up with refills and understanding the fundamental difference between maintenance and prns vs those medications only taken for a very short course and then stopped and not refilled.  - - The proper method of use, as well as anticipated side effects, of a metered-dose inhaler were discussed and demonstrated to the patient using teach back method.   - return with all meds in hand using a trust but verify approach to confirm accurate Medication  Reconciliation The principal here is that until we are certain that the  patients are doing what we've asked, it makes no sense to ask them to do more.   ? Acid (or non-acid) GERD > always difficult to exclude as up to 75% of pts in some series report no assoc GI/ Heartburn symptoms> rec continue max (24h)  acid suppression and diet restrictions/ reviewed     ? Allergy/ airways inflammation   > Prednisone 10 mg take  4 each am x 2 days,   2 each am x 2 days,  1 each am x 2 days and stop and symb 80 2bid  And judicious saba: I spent extra time with pt today reviewing appropriate use of albuterol for prn use on exertion with the following  points: 1) saba is for relief of sob that does not improve by walking a slower pace or resting but rather if the pt does not improve after trying this first. 2) If the pt is convinced, as many are, that saba helps recover from activity faster then it's easy to tell if this is the case by re-challenging : ie stop, take the inhaler, then p 5 minutes try the exact same activity (intensity of workload) that just caused the symptoms and see if they are substantially diminished or not after saba 3) if there is an activity that reproducibly causes the symptoms, try the saba 15 min before the activity on alternate days   If in fact the saba really does help, then fine to continue to use it prn but advised may need to look closer at the maintenance regimen being used to achieve better control of airways disease with exertion.   ? Active sinus dz/ Rhinitis/bronchitis > rx zpak to see if mucus clears and if not may need sinus ct   ? adverse drug effects > concerned about MTX but cxr ok > no change rx   ? Anxiety/depression  > usually at the bottom of this list of usual suspects but  note already on psychotropics and may interfere with adherence and also interpretation of response or lack thereof to symptom management which can be quite subjective.   ? Bronchiectasis >  risk as has RA but nothing to suggest on cxr > consider HRCT chest if can't control symptoms with conservative rx as outlined           Each maintenance medication was reviewed in detail including emphasizing most importantly the difference between maintenance and prns and under what circumstances the prns are to be triggered using an action plan format where appropriate.  Total time for H and P, chart review, counseling, reviewing hfa device(s) and generating customized AVS unique to this office visit / same day charting = 51 min

## 2020-09-26 NOTE — Patient Instructions (Addendum)
Plan A = Automatic = Always=  Symbicort 80 (dulera 100) Take 2 puffs first thing in am and then another 2 puffs about 12 hours later.    Work on inhaler technique:  relax and gently blow all the way out then take a nice smooth deep breath back in, triggering the inhaler at same time you start breathing in.  Hold for up to 5 seconds if you can. Blow symbicort out thru nose. Rinse and gargle with water when done   Plan B = Backup (to supplement plan A, not to replace it) Only use your albuterol inhaler as a rescue medication to be used if you can't catch your breath by resting or doing a relaxed purse lip breathing pattern.  - The less you use it, the better it will work when you need it. - Ok to use the inhaler up to 2 puffs  every 4 hours if you must but call for appointment if use goes up over your usual need - Don't leave home without it !!  (think of it like the spare tire for your car)    Prednisone 10 mg take  4 each am x 2 days,   2 each am x 2 days,  1 each am x 2 days and stop   zpak   Please remember to go to the  x-ray department  @  [facility] for your tests - we will call you with the results when they are available     Please schedule a follow up office visit in 6 weeks, call sooner if needed with PFTs

## 2020-09-29 ENCOUNTER — Encounter: Payer: Self-pay | Admitting: *Deleted

## 2020-11-05 ENCOUNTER — Encounter: Payer: Self-pay | Admitting: Internal Medicine

## 2020-11-05 ENCOUNTER — Other Ambulatory Visit: Payer: Self-pay

## 2020-11-05 ENCOUNTER — Ambulatory Visit: Payer: BC Managed Care – PPO | Admitting: Internal Medicine

## 2020-11-05 DIAGNOSIS — J449 Chronic obstructive pulmonary disease, unspecified: Secondary | ICD-10-CM | POA: Diagnosis not present

## 2020-11-05 NOTE — Progress Notes (Signed)
Shawna Williams, female    DOB: 09-07-1955     MRN: [id number]   Brief patient profile:  67 yowf never smoker h/o allergies as child no asthma and no problems as adult despite dx of RA in 1994 f/b [place] on MTX since 1998 but then onset sob/cough around 2018 eval by Juanetta Gosling 11/22/2017 with POS MCT > Breo 100 but taken seasonally not daily self referred  Back  to pulmonary clinic in [place]  09/26/2020       History of Present Illness  09/26/2020  Pulmonary/ 1st office eval/ Shawna Williams / [place] [facility]  Chief Complaint  Patient presents with  . Consult    Former Dr Juanetta Gosling patient- intermittent productive cough with green phlegm since November 2021  Dyspnea:  Yardwork, swim but uphill walking a problem and  not sure BREO helps  Then flares tend to occur late fall/winter into spring this one occurred in nov 2021 rx again BREO rx pred by RA PA x 10 days temporally  Cough: worse first thing in am  Sleep: ok once asleep bed is flat two pillows does fine  SABA use: up to 2-3 x day  rec Plan A = Automatic = Always=  Symbicort 80 (dulera 100) Take 2 puffs first thing in am and then another 2 puffs about 12 hours later.  Work on inhaler technique:   Plan B = Backup (to supplement plan A, not to replace it) Only use your albuterol inhaler as a rescue medication Prednisone 10 mg take  4 each am x 2 days,   2 each am x 2 days,  1 each am x 2 days and stop  zpak       11/05/2020  f/u ov/[place] office/Maim[facility] re: cough variant asthma  Chief Complaint  Patient presents with  . Follow-up    "I feel much better"  Dyspnea: MMRC1 = can walk nl pace, flat grade, can't hurry or go uphills or steps s sob   Cough: gone  Sleeping: flat bed 2 pillows  SABA use: none  02: none  Covid status: vax x 3      No obvious day to day or daytime variability or assoc excess/ purulent sputum or mucus plugs or hemoptysis or cp or chest tightness, subjective wheeze or overt sinus or hb symptoms.   Sleeping ok  without nocturnal  or early am exacerbation  of respiratory  c/o's or need for noct saba. Also denies any obvious fluctuation of symptoms with weather or environmental changes or other aggravating or alleviating factors except as outlined above   No unusual exposure hx or h/o childhood pna/ asthma or knowledge of premature birth.  Current Allergies, Complete Past Medical History, Past Surgical History, Family History, and Social History were reviewed in [employer] record.  ROS  The following are not active complaints unless bolded Hoarseness, sore throat, dysphagia, dental problems, itching, sneezing,  nasal congestion or discharge of excess mucus or purulent secretions, ear ache,   fever, chills, sweats, unintended wt loss or wt gain, classically pleuritic or exertional cp,  orthopnea pnd or arm/hand swelling  or leg swelling, presyncope, palpitations, abdominal pain, anorexia, nausea, vomiting, diarrhea  or change in bowel habits or change in bladder habits, change in stools or change in urine, dysuria, hematuria,  rash, arthralgias, visual complaints, headache, numbness, weakness or ataxia or problems with walking or coordination,  change in mood or  memory.        Current Meds  Medication  Sig  . albuterol (VENTOLIN HFA) 108 (90 Base) MCG/ACT inhaler Inhale 1-2 puffs into the lungs every 4 (four) hours as needed for wheezing or shortness of breath.  Marland Kitchen aspirin EC 81 MG tablet Take 1 tablet (81 mg total) by mouth daily.  . budesonide-formoterol (SYMBICORT) 80-4.5 MCG/ACT inhaler Take 2 puffs first thing in am and then another 2 puffs about 12 hours later.  . cholecalciferol (VITAMIN D3) 25 MCG (1000 UNIT) tablet Take 1,000 Units by mouth daily.  . Cinnamon 500 MG capsule Take 1,000 mg by mouth daily.  . folic acid (FOLVITE) 1 MG tablet Take 1 mg by mouth daily.  Marland Kitchen LORazepam (ATIVAN) 0.5 MG tablet Take 0.5 mg by mouth daily as needed.  Marland Kitchen losartan-hydrochlorothiazide  (HYZAAR) 100-25 MG tablet Take 1 tablet by mouth daily.  . Multiple Vitamin (MULTIVITAMIN) tablet Take 1 tablet by mouth daily.  . Multiple Vitamins-Minerals (ICAPS AREDS 2 PO) Take 1 capsule by mouth 2 (two) times daily.   Marland Kitchen omeprazole (PRILOSEC) 20 MG capsule Take 20 mg by mouth daily.  Marland Kitchen ORENCIA CLICKJECT 125 MG/ML SOAJ Inject 1 Syringe into the skin once a week.  . rosuvastatin (CRESTOR) 20 MG tablet Take 1 tablet (20 mg total) by mouth daily.  Marland Kitchen topiramate (TOPAMAX) 25 MG capsule Take 25 mg by mouth daily.  . traMADol (ULTRAM) 50 MG tablet Take 1 tablet (50 mg total) by mouth at bedtime. (Patient taking differently: Take 50 mg by mouth every 12 (twelve) hours as needed for moderate pain.)  . traZODone (DESYREL) 50 MG tablet TAKE 1 TABLET BY MOUTH AT BEDTIME (Patient taking differently: Take 50 mg by mouth at bedtime as needed for sleep.)  . venlafaxine XR (EFFEXOR-XR) 75 MG 24 hr capsule Take 75 mg by mouth daily.  . [DISCONTINUED] predniSONE (DELTASONE) 10 MG tablet Take  4 each am x 2 days,   2 each am x 2 days,  1 each am x 2 days and stop                         Past Medical History:  Diagnosis Date  . Arthritis    rhemuatoid  . Asthma    history of asthmas with a persistan cough  . Hyperlipemia   . Hypertension   . Major depressive disorder   . 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 Type 2 diabetes mellitus (HCC)    controlled by diet     Objective:     Wt Readings from Last 3 Encounters:  11/05/20 217 lb 6.4 oz (98.6 kg)  09/26/20 215 lb (97.5 kg)  09/16/20 214 lb (97.1 kg)      Vital signs reviewed  11/05/2020  - Note at rest 02 sats  98% on RA   General appearance:   amb obese wf nad     HEENT : pt wearing mask not removed for exam due to covid -19 concerns.    NECK :  without JVD/Nodes/TM/ nl carotid upstrokes bilaterally   LUNGS: no acc muscle use,  Nl contour chest which is clear to A and P bilaterally without cough on insp or exp  maneuvers   CV:  RRR  no s3 or murmur or increase in P2, and no edema   ABD:  soft and nontender with nl inspiratory excursion in the supine position. No bruits or organomegaly appreciated, bowel sounds nl  MS:  Nl gait/ ext warm without deformities, calf tenderness, cyanosis or clubbing No obvious  joint restrictions   SKIN: warm and dry without lesions    NEURO:  alert, approp, nl sensorium with  no motor or cerebellar deficits apparent.               Assessment

## 2020-11-05 NOTE — Patient Instructions (Addendum)
Plan A = Automatic = Always=   Symbiocrt 80 Take 2 puffs first thing in am and then another 2 puffs about 12 hours later.   Work on inhaler technique:  relax and gently blow all the way out then take a nice smooth deep breath back in, triggering the inhaler at same time you start breathing in.  Hold for up to 5 seconds if you can. Blow out thru nose. Rinse and gargle with water when done  Plan B = Backup (to supplement plan A, not to replace it) Only use your albuterol inhaler as a rescue medication to be used if you can't catch your breath by resting or doing a relaxed purse lip breathing pattern.  - The less you use it, the better it will work when you need it. - Ok to use the inhaler up to 2 puffs  every 4 hours if you must but call for appointment if use goes up over your usual need - Don't leave home without it !!  (think of it like the spare tire for your car)    Please schedule a follow up visit in  6 months but call sooner if needed

## 2020-11-06 ENCOUNTER — Encounter: Payer: Self-pay | Admitting: Internal Medicine

## 2020-11-06 NOTE — Assessment & Plan Note (Signed)
Never smoker  - PFTs nl 2019 x for POS MCT  - Trial of symbicort 80 2bid 09/26/2020 >>>  -  11/05/2020  After extensive coaching inhaler device,  effectiveness =   75%   Despite suboptimal hfa,  All goals of chronic asthma control met including optimal function and elimination of symptoms with minimal need for rescue therapy.  Contingencies discussed in full including contacting this office immediately if not controlling the symptoms using the rule of two's.      F/u 6 m          Each maintenance medication was reviewed in detail including emphasizing most importantly the difference between maintenance and prns and under what circumstances the prns are to be triggered using an action plan format where appropriate.  Total time for H and P, chart review, counseling, reviewing hfa device(s) and generating customized AVS unique to this office visit / same day charting = 34 mn

## 2020-12-21 ENCOUNTER — Encounter: Payer: Self-pay | Admitting: Cardiology

## 2020-12-21 NOTE — Progress Notes (Signed)
Cardiology Office Note  Date: 12/22/2020   ID: Malyn, Aytes Mar 26, 1956, MRN [id number]  PCP:  Kirstie Peri, MD  Cardiologist:  Nona Dell, MD Electrophysiologist:  None   Chief Complaint  Patient presents with  . Cardiac follow-up    History of Present Illness: Shawna Williams is a 65 y.o. female former patient of Dr. Purvis Sheffield now presenting to establish follow-up with me.  I reviewed her records and updated the chart.  She was last seen in December 2021 by Mr. Vincenza Hews NP.  She is here for a routine visit, reports no exertional chest pain and overall feeling well.  She enjoys traveling with her grandchildren for baseball games and tournaments.  She is following with Dr. Sherene Sires for management of asthma, I reviewed the note from April.  I went over her medications.  She remains on aspirin and Crestor and anticipates follow-up lab work with Dr. Sherryll Burger this summer.  She has a history of nonobstructive coronary atherosclerosis by coronary CTA last year.  Past Medical History:  Diagnosis Date  . Asthma   . Coronary atherosclerosis    Nonobstructive by coronary CTA April 2021  . Essential hypertension   . Hyperlipemia   . Major depressive disorder   . 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 Type 2 diabetes mellitus (HCC)     Past Surgical History:  Procedure Laterality Date  . CESAREAN SECTION  1981 and 1985  . CHOLECYSTECTOMY N/A 05/18/2018   Procedure: LAPAROSCOPIC CHOLECYSTECTOMY;  Surgeon: Lucretia Roers, MD;  Location: [facility];  Service: General;  Laterality: N/A;  . COLONOSCOPY N/A 01/01/2016   Procedure: COLONOSCOPY;  Surgeon: Malissa Hippo, MD;  Location: [facility];  Service: Endoscopy;  Laterality: N/A;  930  . FOOT SURGERY Left    with screws  . TONSILLECTOMY      Current Outpatient Medications  Medication Sig Dispense Refill  . albuterol (VENTOLIN HFA) 108 (90 Base) MCG/ACT inhaler Inhale 1-2 puffs into the lungs every 4 (four) hours as needed for  wheezing or shortness of breath. 18 g 1  . aspirin EC 81 MG tablet Take 1 tablet (81 mg total) by mouth daily.    . budesonide-formoterol (SYMBICORT) 80-4.5 MCG/ACT inhaler Take 2 puffs first thing in am and then another 2 puffs about 12 hours later. 1 each 11  . cholecalciferol (VITAMIN D3) 25 MCG (1000 UNIT) tablet Take 1,000 Units by mouth daily.    . Cinnamon 500 MG capsule Take 1,000 mg by mouth daily.    . folic acid (FOLVITE) 1 MG tablet Take 1 mg by mouth daily.    Marland Kitchen LORazepam (ATIVAN) 0.5 MG tablet Take 0.5 mg by mouth daily as needed.    Marland Kitchen losartan-hydrochlorothiazide (HYZAAR) 100-25 MG tablet Take 1 tablet by mouth daily.    . metFORMIN (GLUCOPHAGE) 500 MG tablet Take 1 tablet by mouth daily.    . Multiple Vitamin (MULTIVITAMIN) tablet Take 1 tablet by mouth daily.    . Multiple Vitamins-Minerals (ICAPS AREDS 2 PO) Take 1 capsule by mouth 2 (two) times daily.     Marland Kitchen omeprazole (PRILOSEC) 20 MG capsule Take 20 mg by mouth daily.    Marland Kitchen ORENCIA CLICKJECT 125 MG/ML SOAJ Inject 1 Syringe into the skin once a week.    . rosuvastatin (CRESTOR) 20 MG tablet Take 20 mg by mouth daily.    Marland Kitchen topiramate (TOPAMAX) 25 MG capsule Take 25 mg by mouth daily.    . traMADol (ULTRAM) 50  MG tablet Take 1 tablet (50 mg total) by mouth at bedtime. 30 tablet 0  . traZODone (DESYREL) 50 MG tablet TAKE 1 TABLET BY MOUTH AT BEDTIME 30 tablet 0  . venlafaxine XR (EFFEXOR-XR) 75 MG 24 hr capsule Take 75 mg by mouth daily.  3   No current facility-administered medications for this visit.   Allergies:  Ketorolac, Diltiazem, and Codeine   ROS: No palpitations or syncope.  Physical Exam: VS:  BP 110/70   Pulse 95   Ht 5\' 5"  (1.651 m)   Wt 209 lb 12.8 oz (95.2 kg)   SpO2 97%   BMI 34.91 kg/m , BMI Body mass index is 34.91 kg/m.  Wt Readings from Last 3 Encounters:  12/22/20 209 lb 12.8 oz (95.2 kg)  11/05/20 217 lb 6.4 oz (98.6 kg)  09/26/20 215 lb (97.5 kg)    General: Patient appears comfortable at  rest. HEENT: Conjunctiva and lids normal, wearing a mask. Neck: Supple, no elevated JVP or carotid bruits, no thyromegaly. Lungs: Few expiratory squeaks without wheezing, nonlabored breathing at rest. Cardiac: Regular rate and rhythm, no S3 or significant systolic murmur, no pericardial rub. Extremities: No pitting edema.  ECG:  An ECG dated 06/27/2020 was personally reviewed today and demonstrated:  Sinus rhythm.  Recent Labwork:  March 2021: BUN 16, creatinine 0.91, potassium 3.6  Other Studies Reviewed Today:  Coronary CT FFR 10/19/2019:  1. Left Main: 0.98; no significant stenosis. 2. Mid LAD: 0.86; no significant stenosis. 3. First Diagonal: 0.81; no significant stenosis. 4. LCX: 0.92; no significant stenosis. 5. RCA: 0.92; no significant stenosis.  IMPRESSION: 1.  CT FFR of mid LAD and D1 are negative.  Assessment and Plan:  1.  Nonobstructive coronary atherosclerosis by coronary CTA in April of last year.  She is asymptomatic at this time and plan will be observation on medical therapy.  Continue aspirin and Crestor.  Also discussed walking plan.  Would aim for LDL 70 or less.  2.  Essential hypertension, currently on Hyzaar with follow-up by Dr. May.  Blood pressure is well controlled today.  Medication Adjustments/Labs and Tests Ordered: Current medicines are reviewed at length with the patient today.  Concerns regarding medicines are outlined above.   Tests Ordered: No orders of the defined types were placed in this encounter.   Medication Changes: No orders of the defined types were placed in this encounter.   Disposition:  Follow up 1 year.  Signed, Sherryll Burger, MD, [facility] 12/22/2020 10:39 AM    [facility] [address] [place], [place], [place] Phone: [phone]; Fax: [phone]

## 2020-12-22 ENCOUNTER — Ambulatory Visit: Payer: BC Managed Care – PPO | Admitting: Cardiology

## 2020-12-22 ENCOUNTER — Encounter: Payer: Self-pay | Admitting: Cardiology

## 2020-12-22 VITALS — BP 110/70 | HR 95 | Ht 65.0 in | Wt 209.8 lb

## 2020-12-22 DIAGNOSIS — I2510 Atherosclerotic heart disease of native coronary artery without angina pectoris: Secondary | ICD-10-CM | POA: Diagnosis not present

## 2020-12-22 DIAGNOSIS — I10 Essential (primary) hypertension: Secondary | ICD-10-CM

## 2020-12-22 NOTE — Patient Instructions (Addendum)

## 2021-02-16 ENCOUNTER — Telehealth: Payer: Self-pay | Admitting: Internal Medicine

## 2021-02-16 DIAGNOSIS — J449 Chronic obstructive pulmonary disease, unspecified: Secondary | ICD-10-CM

## 2021-02-17 NOTE — Telephone Encounter (Signed)
Spoke with the pt and scheduled appt for PFT and ROV with MW for Oct in [place]. Pt aware of office location and to bring her vax card.

## 2021-02-17 NOTE — Telephone Encounter (Signed)
Yes dx chronic asthmatic bronchitis with pfts/ cxr same day if possible in Oct 2022

## 2021-02-17 NOTE — Telephone Encounter (Signed)
Called and spoke with patient who states she is changing to Shawna Williams 9/2.  Pt to follow-up in October and was asking if she still needed to have PFT.     Dr. Sherene Sires Please advise.

## 2021-03-13 ENCOUNTER — Telehealth: Payer: Self-pay | Admitting: Internal Medicine

## 2021-03-13 NOTE — Telephone Encounter (Signed)
Spoke with the pt  She states cough never really stopped since the last ov and seems to be progressively getting worse again  She states sometimes coughs until she has SOB  No fever, aches, wheezing  OV with MW for 03/17/21-she feels okay waiting until then and will call sooner or seek emergent care if gets worse

## 2021-03-17 ENCOUNTER — Encounter: Payer: Self-pay | Admitting: Internal Medicine

## 2021-03-17 ENCOUNTER — Ambulatory Visit: Payer: BC Managed Care – PPO | Admitting: Internal Medicine

## 2021-03-17 ENCOUNTER — Other Ambulatory Visit: Payer: Self-pay

## 2021-03-17 DIAGNOSIS — J449 Chronic obstructive pulmonary disease, unspecified: Secondary | ICD-10-CM | POA: Diagnosis not present

## 2021-03-17 MED ORDER — PREDNISONE 10 MG PO TABS: ORAL_TABLET | ORAL | 0 refills | Status: DC

## 2021-03-17 NOTE — Assessment & Plan Note (Signed)
Never smoker  - PFTs nl 2019 x for POS MCT  - Trial of symbicort 80 2bid 09/26/2020 >>>  - 03/17/2021  After extensive coaching inhaler device,  effectiveness =    90%   Acute flare ? From viral uri with neg covid testing vs allergic vs RA bronchiolitis flare with assoc rhinitis strongly favoring the former 2 > rx Prednisone 10 mg take  4 each am x 2 days,   2 each am x 2 days,  1 each am x 2 days and stop   If another flare this year would rec symbicort increase to 160 2bid          Each maintenance medication was reviewed in detail including emphasizing most importantly the difference between maintenance and prns and under what circumstances the prns are to be triggered using an action plan format where appropriate.  Total time for H and P, chart review, counseling, reviewing hfa device(s) and generating customized AVS unique to this office visit / same day charting = 26 min

## 2021-03-17 NOTE — Progress Notes (Signed)
Shawna Williams, female    DOB: [birth year]     MRN: [id number]   Brief patient profile:  73 yowf never smoker h/o allergies as child no asthma and no problems as adult despite dx of RA in 1994 f/b Beekman on MTX since 1998 but then onset sob/cough around 2018 eval by Juanetta Gosling 11/22/2017 with POS MCT > Breo 100 but taken seasonally not daily self referred  Back  to pulmonary clinic in [place]  09/26/2020       History of Present Illness  09/26/2020  Pulmonary/ 1st office eval/ Sherene Sires / [facility]  Chief Complaint  Patient presents with   Consult    Former Dr Juanetta Gosling patient- intermittent productive cough with green phlegm since November 2021  Dyspnea:  Yardwork, swim but uphill walking a problem and  not sure BREO helps  Then flares tend to occur late fall/winter into spring this one occurred in nov 2021 rx again BREO rx pred by RA PA x 10 days temporally  Cough: worse first thing in am  Sleep: ok once asleep bed is flat two pillows does fine  SABA use: up to 2-3 x day  rec Plan A = Automatic = Always=  Symbicort 80 (dulera 100) Take 2 puffs first thing in am and then another 2 puffs about 12 hours later.  Work on inhaler technique:   Plan B = Backup (to supplement plan A, not to replace it) Only use your albuterol inhaler as a rescue medication Prednisone 10 mg take  4 each am x 2 days,   2 each am x 2 days,  1 each am x 2 days and stop  zpak       11/05/2020  f/u ov/[place] office/Alaiah Lundy re: cough variant asthma  Chief Complaint  Patient presents with   Follow-up    "I feel much better"  Dyspnea: MMRC1 = can walk nl pace, flat grade, can't hurry or go uphills or steps s sob   Cough: gone  Sleeping: flat bed 2 pillows  SABA use: none  02: none  Covid status: vax x 3   Rec Plan A = Automatic = Always=   Symbiocrt 80 Take 2 puffs first thing in am and then another 2 puffs about 12 hours later.  Work on inhaler technique:    Plan B = Backup (to supplement plan A, not to  replace it) Only use your albuterol inhaler as a rescue medication   Please schedule a follow up visit in  6 months but call sooner if needed    03/17/2021  f/u ov/[place] office/Itay Mella re: cough variant asthma / cough worse since prior ov/ maint on symb 80 .2bid  Chief Complaint  Patient presents with   Follow-up    Cough had been worse recently but has improved over the past few days. Cough is non prod. She rarely uses her albuterol inhaler.    Dyspnea:  MMRC1 = can walk nl pace, flat grade, can't hurry or go uphills or steps s sob   Cough: slt rattle x one week never brings it / assoc with sneezing  Sleeping: flat bed / 2 pillows  SABA use: nl no need and only slt increase since worse cough x 1 week  02: none  Covid status: x 3 vax/ omicron  Lung cancer screening: n/a    No obvious day to day or daytime variability or assoc excess/ purulent sputum or mucus plugs or hemoptysis or cp or chest tightness, subjective wheeze or  overt sinus or hb symptoms.   Sleeping  without nocturnal  or early am exacerbation  of respiratory  c/o's or need for noct saba. Also denies any obvious fluctuation of symptoms with weather or environmental changes or other aggravating or alleviating factors except as outlined above   No unusual exposure hx or h/o childhood pna/ asthma or knowledge of premature birth.  Current Allergies, Complete Past Medical History, Past Surgical History, Family History, and Social History were reviewed in [employer] record.  ROS  The following are not active complaints unless bolded Hoarseness, sore throat, dysphagia, dental problems, itching, sneezing,  nasal congestion or discharge of excess mucus or purulent secretions, ear ache,   fever, chills, sweats, unintended wt loss or wt gain, classically pleuritic or exertional cp,  orthopnea pnd or arm/hand swelling  or leg swelling, presyncope, palpitations, abdominal pain, anorexia, nausea, vomiting, diarrhea   or change in bowel habits or change in bladder habits, change in stools or change in urine, dysuria, hematuria,  rash, arthralgias, visual complaints, headache, numbness, weakness or ataxia or problems with walking or coordination,  change in mood or  memory.        Current Meds  Medication Sig   albuterol (VENTOLIN HFA) 108 (90 Base) MCG/ACT inhaler Inhale 1-2 puffs into the lungs every 4 (four) hours as needed for wheezing or shortness of breath.   aspirin EC 81 MG tablet Take 1 tablet (81 mg total) by mouth daily.   budesonide-formoterol (SYMBICORT) 80-4.5 MCG/ACT inhaler Take 2 puffs first thing in am and then another 2 puffs about 12 hours later.   cholecalciferol (VITAMIN D3) 25 MCG (1000 UNIT) tablet Take 1,000 Units by mouth daily.   Cinnamon 500 MG capsule Take 1,000 mg by mouth daily.   folic acid (FOLVITE) 1 MG tablet Take 1 mg by mouth daily.   LORazepam (ATIVAN) 0.5 MG tablet Take 0.5 mg by mouth daily as needed.   losartan-hydrochlorothiazide (HYZAAR) 100-25 MG tablet Take 1 tablet by mouth daily.   metFORMIN (GLUCOPHAGE) 500 MG tablet Take 1 tablet by mouth daily.   Multiple Vitamin (MULTIVITAMIN) tablet Take 1 tablet by mouth daily.   Multiple Vitamins-Minerals (ICAPS AREDS 2 PO) Take 1 capsule by mouth 2 (two) times daily.    omeprazole (PRILOSEC) 20 MG capsule Take 20 mg by mouth daily.   ORENCIA CLICKJECT 125 MG/ML SOAJ Inject 1 Syringe into the skin once a week.        rosuvastatin (CRESTOR) 20 MG tablet Take 20 mg by mouth daily.   topiramate (TOPAMAX) 25 MG capsule Take 25 mg by mouth daily.   traMADol (ULTRAM) 50 MG tablet Take 1 tablet (50 mg total) by mouth at bedtime.   traZODone (DESYREL) 50 MG tablet TAKE 1 TABLET BY MOUTH AT BEDTIME   venlafaxine XR (EFFEXOR-XR) 75 MG 24 hr capsule Take 75 mg by mouth daily.                         Past Medical History:  Diagnosis Date   Arthritis    rhemuatoid   Asthma    history of asthmas with a persistan cough    Hyperlipemia    Hypertension    Major depressive disorder    Rheumatoid arteritis (HCC) 12/24/2016   Type 2 diabetes mellitus (HCC)    controlled by diet     Objective:    03/17/2021       215  11/05/20 217 lb  6.4 oz (98.6 kg)  09/26/20 215 lb (97.5 kg)  09/16/20 214 lb (97.1 kg)      Vital signs reviewed  03/17/2021  - Note at rest 02 sats  98% on RA   General appearance:    amb wf nad/ rattling cough    HEENT : pt wearing mask not removed for exam due to covid -19 concerns.    NECK :  without JVD/Nodes/TM/ nl carotid upstrokes bilaterally   LUNGS: no acc muscle use,  Nl contour chest  insp squeaks/ trace exp wheeze  bilaterally without cough on insp or exp maneuvers   CV:  RRR  no s3 or murmur or increase in P2, and no edema   ABD:  soft and nontender with nl inspiratory excursion in the supine position. No bruits or organomegaly appreciated, bowel sounds nl  MS:  Nl gait/ ext warm without deformities, calf tenderness, cyanosis or clubbing No obvious joint restrictions   SKIN: warm and dry without lesions    NEURO:  alert, approp, nl sensorium with  no motor or cerebellar deficits apparent.                 Assessment

## 2021-03-17 NOTE — Patient Instructions (Signed)
No change in your maintenance   Prednisone 10 mg take  4 each am x 2 days,   2 each am x 2 days,  1 each am x 2 days and stop   Keep your previous appt, call sooner if needed

## 2021-03-20 ENCOUNTER — Other Ambulatory Visit: Payer: Self-pay | Admitting: Internal Medicine

## 2021-04-20 ENCOUNTER — Encounter: Payer: Self-pay | Admitting: Internal Medicine

## 2021-04-20 ENCOUNTER — Ambulatory Visit (INDEPENDENT_AMBULATORY_CARE_PROVIDER_SITE_OTHER): Payer: Medicare Other | Admitting: Internal Medicine

## 2021-04-20 ENCOUNTER — Other Ambulatory Visit (INDEPENDENT_AMBULATORY_CARE_PROVIDER_SITE_OTHER): Payer: Medicare Other

## 2021-04-20 ENCOUNTER — Other Ambulatory Visit: Payer: Self-pay

## 2021-04-20 DIAGNOSIS — J449 Chronic obstructive pulmonary disease, unspecified: Secondary | ICD-10-CM | POA: Diagnosis not present

## 2021-04-20 DIAGNOSIS — I2510 Atherosclerotic heart disease of native coronary artery without angina pectoris: Secondary | ICD-10-CM

## 2021-04-20 LAB — PULMONARY FUNCTION TEST
DL/VA % pred: 125 %
DL/VA: 5.15 ml/min/mmHg/L
DLCO cor % pred: 121 %
DLCO cor: 25.69 ml/min/mmHg
DLCO unc % pred: 121 %
DLCO unc: 25.69 ml/min/mmHg
FEF 25-75 Post: 3.38 L/sec
FEF 25-75 Pre: 2.4 L/sec
FEF2575-%Change-Post: 40 %
FEF2575-%Pred-Post: 150 %
FEF2575-%Pred-Pre: 106 %
FEV1-%Change-Post: 10 %
FEV1-%Pred-Post: 100 %
FEV1-%Pred-Pre: 91 %
FEV1-Post: 2.63 L
FEV1-Pre: 2.38 L
FEV1FVC-%Change-Post: 1 %
FEV1FVC-%Pred-Pre: 105 %
FEV6-%Change-Post: 8 %
FEV6-%Pred-Post: 97 %
FEV6-%Pred-Pre: 89 %
FEV6-Post: 3.18 L
FEV6-Pre: 2.93 L
FEV6FVC-%Pred-Post: 104 %
FEV6FVC-%Pred-Pre: 104 %
FVC-%Change-Post: 8 %
FVC-%Pred-Post: 93 %
FVC-%Pred-Pre: 86 %
FVC-Post: 3.18 L
FVC-Pre: 2.93 L
Post FEV1/FVC ratio: 83 %
Post FEV6/FVC ratio: 100 %
Pre FEV1/FVC ratio: 81 %
Pre FEV6/FVC Ratio: 100 %
RV % pred: 49 %
RV: 1.09 L
TLC % pred: 78 %
TLC: 4.19 L

## 2021-04-20 LAB — CBC WITH DIFFERENTIAL/PLATELET
Basophils Absolute: 0.1 10*3/uL (ref 0.0–0.1)
Basophils Relative: 1 % (ref 0.0–3.0)
Eosinophils Absolute: 0.8 10*3/uL — ABNORMAL HIGH (ref 0.0–0.7)
Eosinophils Relative: 9 % — ABNORMAL HIGH (ref 0.0–5.0)
HCT: 37.7 % (ref 36.0–46.0)
Hemoglobin: 12.8 g/dL (ref 12.0–15.0)
Lymphocytes Relative: 38.5 % (ref 12.0–46.0)
Lymphs Abs: 3.5 10*3/uL (ref 0.7–4.0)
MCHC: 34 g/dL (ref 30.0–36.0)
MCV: 91.1 fl (ref 78.0–100.0)
Monocytes Absolute: 0.6 10*3/uL (ref 0.1–1.0)
Monocytes Relative: 6.1 % (ref 3.0–12.0)
Neutro Abs: 4.1 10*3/uL (ref 1.4–7.7)
Neutrophils Relative %: 45.4 % (ref 43.0–77.0)
Platelets: 308 10*3/uL (ref 150.0–400.0)
RBC: 4.14 Mil/uL (ref 3.87–5.11)
RDW: 13.2 % (ref 11.5–15.5)
WBC: 9 10*3/uL (ref 4.0–10.5)

## 2021-04-20 MED ORDER — MONTELUKAST SODIUM 10 MG PO TABS: 10.0000 mg | ORAL_TABLET | Freq: Every day | ORAL | 11 refills | Status: DC

## 2021-04-20 NOTE — Patient Instructions (Signed)
Full PFT performed today. °

## 2021-04-20 NOTE — Progress Notes (Signed)
Shawna Williams, female    DOB: 1955-11-10     MRN: [id number]   Brief patient profile:  21  yowf never smoker h/o allergies as child no asthma and no problems as adult despite dx of RA in 1994 f/b [place]ekman on MTX since 1998 but then onset sob/cough around 2018 eval by Juanetta Gosling 11/22/2017 with POS MCT > Breo 100 but taken seasonally not daily self referred  Back  to pulmonary clinic in [place]  09/26/2020       History of Present Illness  09/26/2020  Pulmonary/ 1st office eval/ Sherene Sires / [facility]  Chief Complaint  Patient presents with   Consult    Former Dr Juanetta Gosling patient- intermittent productive cough with green phlegm since November 2021  Dyspnea:  Yardwork, swim but uphill walking a problem and  not sure BREO helps  Then flares tend to occur late fall/winter into spring this one occurred in nov 2021 rx again BREO rx pred by RA PA x 10 days temporally  Cough: worse first thing in am  Sleep: ok once asleep bed is flat two pillows does fine  SABA use: up to 2-3 x day  rec Plan A = Automatic = Always=  Symbicort 80 (dulera 100) Take 2 puffs first thing in am and then another 2 puffs about 12 hours later.  Work on inhaler technique:   Plan B = Backup (to supplement plan A, not to replace it) Only use your albuterol inhaler as a rescue medication Prednisone 10 mg take  4 each am x 2 days,   2 each am x 2 days,  1 each am x 2 days and stop  zpak       11/05/2020  f/u ov/[place] office/Lamira Borin re: cough variant asthma  Chief Complaint  Patient presents with   Follow-up    "I feel much better"  Dyspnea: MMRC1 = can walk nl pace, flat grade, can't hurry or go uphills or steps s sob   Cough: gone  Sleeping: flat bed 2 pillows  SABA use: none  02: none  Covid status: vax x 3   Rec Plan A = Automatic = Always=   Symbiocrt 80 Take 2 puffs first thing in am and then another 2 puffs about 12 hours later.  Work on inhaler technique:    Plan B = Backup (to supplement plan A, not to  replace it) Only use your albuterol inhaler as a rescue medication   Please schedule a follow up visit in  6 months but call sooner if needed        04/20/2021  f/u ov/[facility] Nose[facility] re: cough variant asthma   maint on symbiocrt 80 2bid   Chief Complaint  Patient presents with   Follow-up    Asthma  Dyspnea:  walks a bit slower than others at stores and steps are problem Cough: variable but never noct/ thinks RA med may have caued it  Sleeping: falt bed/ 2 pillows  SABA use: avg once a week  02: none  Covid status:   vax 3/ had omicaron   No obvious day to day or daytime variability or assoc excess/ purulent sputum or mucus plugs or hemoptysis or cp or chest tightness, subjective wheeze or overt sinus or hb symptoms.   Sleeping as above  without nocturnal  or early am exacerbation  of respiratory  c/o's or need for noct saba. Also denies any obvious fluctuation of symptoms with weather or environmental changes or other aggravating or alleviating  factors except as outlined above   No unusual exposure hx or h/o childhood pna/ asthma or knowledge of premature birth.  Current Allergies, Complete Past Medical History, Past Surgical History, Family History, and Social History were reviewed in [employer] record.  ROS  The following are not active complaints unless bolded Hoarseness, sore throat, dysphagia, dental problems, itching, sneezing,  nasal congestion or discharge of excess mucus or purulent secretions, ear ache,   fever, chills, sweats, unintended wt loss or wt gain, classically pleuritic or exertional cp,  orthopnea pnd or arm/hand swelling  or leg swelling, presyncope, palpitations, abdominal pain, anorexia, nausea, vomiting, diarrhea  or change in bowel habits or change in bladder habits, change in stools or change in urine, dysuria, hematuria,  rash, arthralgias, visual complaints, headache, numbness, weakness or ataxia or problems with walking or coordination,   change in mood or  memory.        Current Meds  Medication Sig   albuterol (VENTOLIN HFA) 108 (90 Base) MCG/ACT inhaler inhale 1-2 puffs EVERY 4 HOURS AS NEEDED FOR wheezing OR SHORTNESS OF BREATH   aspirin EC 81 MG tablet Take 1 tablet (81 mg total) by mouth daily.   budesonide-formoterol (SYMBICORT) 80-4.5 MCG/ACT inhaler Take 2 puffs first thing in am and then another 2 puffs about 12 hours later.   cholecalciferol (VITAMIN D3) 25 MCG (1000 UNIT) tablet Take 1,000 Units by mouth daily.   Cinnamon 500 MG capsule Take 1,000 mg by mouth daily.   folic acid (FOLVITE) 1 MG tablet Take 1 mg by mouth daily.   LORazepam (ATIVAN) 0.5 MG tablet Take 0.5 mg by mouth daily as needed.   losartan-hydrochlorothiazide (HYZAAR) 100-25 MG tablet Take 1 tablet by mouth daily.   metFORMIN (GLUCOPHAGE) 500 MG tablet Take 1 tablet by mouth daily.   Multiple Vitamin (MULTIVITAMIN) tablet Take 1 tablet by mouth daily.   Multiple Vitamins-Minerals (ICAPS AREDS 2 PO) Take 1 capsule by mouth 2 (two) times daily.    omeprazole (PRILOSEC) 20 MG capsule Take 20 mg by mouth daily.   ORENCIA CLICKJECT 125 MG/ML SOAJ Inject 1 Syringe into the skin once a week.        rosuvastatin (CRESTOR) 20 MG tablet Take 20 mg by mouth daily.   topiramate (TOPAMAX) 25 MG capsule Take 25 mg by mouth daily.   traMADol (ULTRAM) 50 MG tablet Take 1 tablet (50 mg total) by mouth at bedtime.   traZODone (DESYREL) 50 MG tablet TAKE 1 TABLET BY MOUTH AT BEDTIME   venlafaxine XR (EFFEXOR-XR) 75 MG 24 hr capsule Take 75 mg by mouth daily.        Past Medical History:  Diagnosis Date   Arthritis    rhemuatoid   Asthma    history of asthmas with a persistan cough   Hyperlipemia    Hypertension    Major depressive disorder    Rheumatoid arteritis (HCC) 12/24/2016   Type 2 diabetes mellitus (HCC)    controlled by diet     Objective:    04/20/2021       215 03/17/2021       215  11/05/20 217 lb 6.4 oz (98.6 kg)  09/26/20 215 lb  (97.5 kg)  09/16/20 214 lb (97.1 kg)    Vital signs reviewed  04/20/2021  - Note at rest 02 sats  96% on RA   General appearance:    pleasant amb late middle aged wf nad     HEENT : pt  wearing mask not removed for exam due to covid -19 concerns.    NECK :  without JVD/Nodes/TM/ nl carotid upstrokes bilaterally   LUNGS: no acc muscle use,  Nl contour chest which is clear to A and P bilaterally with  cough on  deep insp   maneuvers   CV:  RRR  no s3 or murmur or increase in P2, and no edema   ABD:  soft and nontender with nl inspiratory excursion in the supine position. No bruits or organomegaly appreciated, bowel sounds nl  MS:  Nl gait/ ext warm without deformities, calf tenderness, cyanosis or clubbing No obvious joint restrictions   SKIN: warm and dry without lesions    NEURO:  alert, approp, nl sensorium with  no motor or cerebellar deficits apparent.     Assessment

## 2021-04-20 NOTE — Assessment & Plan Note (Addendum)
Never smoker  - PFTs nl 2019 x for POS MCT  - Trial of symbicort 80 2bid 09/26/2020 >>>  - 03/17/2021  After extensive coaching inhaler device,  effectiveness =    90%  - PFT's  04/20/2021  FEV1 2.63 (100 % ) ratio 0.83  p 10 % improvement from saba p nothing prior to study with DLCO  25.16 (121%) corrects to 5.15 (123%)  for alv volume and FV curve nl  - 04/20/2021  After extensive coaching inhaler device,  effectiveness =    90%  - 04/20/2021 trial of singulair - Allergy profile 04/20/2021 >  Eos 0. /  IgE   - 04/20/2021   Walked on RA x  3  lap(s) =  approx 750 @ moderate pace, stopped due to end of study with lowest 02 sats 98%  I do suspect asthma here but it is likely well controlled on symbicort 80 and reported response of symptoms p prednisone that is not maintained on symbicort could be allergic rhinitis >> asthma so added singulair and will do allergy screening  Plus reviewed optimal timing for ppi and gerd diet for the cough component of her problem which she is convinced started p rx for RA (defer to [facility] as I see no evidence of ILD related to meds or RA at this point)   >>> f/u in 3 m, sooner if needed  Each maintenance medication was reviewed in detail including emphasizing most importantly the difference between maintenance and prns and under what circumstances the prns are to be triggered using an action plan format where appropriate.  Total time for H and P, chart review, counseling, reviewing hfa device(s) , directly observing portions of ambulatory 02 saturation study/ and generating customized AVS unique to this office visit / same day charting  > 30 min

## 2021-04-20 NOTE — Progress Notes (Signed)
Full PFT performed today. °

## 2021-04-20 NOTE — Patient Instructions (Addendum)
Plan A = Automatic = Always=    symbicort  80 Take 2 puffs first thing in am and then another 2 puffs about 12 hours later.  And add singulair (montelukast) 10 mg in the evening and be sure you take prilosec 20 mg 1 or 2  about 30 min to an hour before your first meal of the day   Plan B = Backup (to supplement plan A, not to replace it) Only use your albuterol inhaler as a rescue medication to be used if you can't catch your breath by resting or doing a relaxed purse lip breathing pattern.  - The less you use it, the better it will work when you need it. - Ok to use the inhaler up to 2 puffs  every 4 hours if you must but call for appointment if use goes up over your usual need - Don't leave home without it !!  (think of it like the spare tire for your car)   Ok to try albuterol 15 min before an activity (on alternating days)  that you know would usually make you short of breath and see if it makes any difference and if makes none then don't take albuterol after activity unless you can't catch your breath as this means it's the resting that helps, not the albuterol.      To get the most out of exercise, you need to be continuously aware that you are short of breath, but never out of breath, for at least 30 minutes daily. As you improve, it will actually be easier for you to do the same amount of exercise  in  30 minutes so always push to the level where you are short of breath.  Once you can do this, push for longer duration or repeat it after at least 4 hours of rest.  GERD (REFLUX)  is an extremely common cause of respiratory symptoms just like yours , many times with no obvious heartburn at all.    It can be treated with medication, but also with lifestyle changes including elevation of the head of your bed (ideally with 6 -8inch blocks under the headboard of your bed),  Smoking cessation, avoidance of late meals, excessive alcohol, and avoid fatty foods, chocolate, peppermint, colas, red wine,  and acidic juices such as orange juice.  NO MINT OR MENTHOL PRODUCTS SO NO COUGH DROPS  USE SUGARLESS CANDY INSTEAD (Jolley ranchers or Stover's or Life Savers) or even ice chips will also do - the key is to swallow to prevent all throat clearing. NO OIL BASED VITAMINS - use powdered substitutes.  Avoid fish oil when coughing.   Please remember to go to the lab department   for your tests - we will call you with the results when they are available.      Please schedule a follow up visit in 3 months but call sooner if needed - [place] office

## 2021-04-22 LAB — IGE: IgE (Immunoglobulin E), Serum: 685 kU/L — ABNORMAL HIGH (ref <=114)

## 2021-04-23 ENCOUNTER — Other Ambulatory Visit: Payer: Self-pay | Admitting: Internal Medicine

## 2021-04-23 DIAGNOSIS — J449 Chronic obstructive pulmonary disease, unspecified: Secondary | ICD-10-CM

## 2021-04-23 DIAGNOSIS — R768 Other specified abnormal immunological findings in serum: Secondary | ICD-10-CM

## 2021-04-23 NOTE — Progress Notes (Signed)
Spoke with pt and notified of results per Dr. Sherene Sires. Pt verbalized understanding and denied any questions.  She was agreeable to allergy referral and I have put this in.

## 2021-04-27 ENCOUNTER — Other Ambulatory Visit: Payer: Self-pay | Admitting: Orthopaedic Surgery

## 2021-04-27 DIAGNOSIS — G5602 Carpal tunnel syndrome, left upper limb: Secondary | ICD-10-CM | POA: Diagnosis not present

## 2021-04-27 MED ORDER — HYDROCODONE-ACETAMINOPHEN 5-325 MG PO TABS: 1.0000 | ORAL_TABLET | Freq: Four times a day (QID) | ORAL | 0 refills | Status: DC | PRN

## 2021-05-07 ENCOUNTER — Other Ambulatory Visit: Payer: Self-pay

## 2021-05-07 ENCOUNTER — Ambulatory Visit (INDEPENDENT_AMBULATORY_CARE_PROVIDER_SITE_OTHER): Payer: Medicare Other | Admitting: Orthopaedic Surgery

## 2021-05-07 DIAGNOSIS — G5602 Carpal tunnel syndrome, left upper limb: Secondary | ICD-10-CM

## 2021-05-07 NOTE — Progress Notes (Signed)
   Post-Op Visit Note   Patient: Shawna Williams           Date of Birth: 1955/07/23           MRN: [id number] Visit Date: 05/07/2021 PCP: Kirstie Peri, MD   Assessment & Plan: 10 days post left carpal tunnel release incision looks good she will return Monday or Tuesday nurse visit only for suture removal and Steri-Strips.  Good relief of preop symptoms.  She is already had the right carpal tunnel release performed.  She can follow-up with me as needed.  Chief Complaint:  Chief Complaint  Patient presents with   Left Wrist - Follow-up    No problems   Visit Diagnoses:  1. Carpal tunnel syndrome, left upper limb     Plan: Return beginning of next week for nurse visit only for suture removal and she can return to see me if she has future problems.  She is happy with the results of surgery.  Follow-Up Instructions: No follow-ups on file.   Orders:  No orders of the defined types were placed in this encounter.  No orders of the defined types were placed in this encounter.   Imaging: No results found.  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) 06/03/2016   High risk medication use 06/03/2016   Myofascial muscle pain 06/03/2016   Other fatigue 06/03/2016   Other insomnia 06/03/2016   Chronic pain syndrome 06/03/2016   Past Medical History:  Diagnosis Date   Asthma    Coronary atherosclerosis    Nonobstructive by coronary CTA  April 2021   Essential hypertension    Hyperlipemia    Major depressive disorder    Rheumatoid arteritis (HCC) 12/24/2016   Type 2 diabetes mellitus (HCC)     Family History  Problem Relation Age of Onset   Stroke Mother    Diabetes Mother    Heart attack Father    Cancer Sister    Diabetes Brother    Diabetes Sister    Heart attack Sister     Past Surgical History:  Procedure Laterality Date   CESAREAN SECTION  1981 and 1985   CHOLECYSTECTOMY N/A 05/18/2018   Procedure: LAPAROSCOPIC CHOLECYSTECTOMY;  Surgeon: Lucretia Roers, MD;  Location: [facility];  Service: General;  Laterality: N/A;   COLONOSCOPY N/A 01/01/2016   Procedure: COLONOSCOPY;  Surgeon: Malissa Hippo, MD;  Location[facility];  Service: Endoscopy;  Laterality: N/A;  930   FOOT SURGERY Left    with screws   TONSILLECTOMY     Social History   Occupational History   Not on file  Tobacco Use   Smoking status: Never   Smokeless tobacco: Never  Vaping Use   Vaping Use: Never used  Substance and Sexual Activity   Alcohol use: Not Currently    Comment: rarely   Drug use: No   Sexual activity: Not on file

## 2021-05-21 ENCOUNTER — Telehealth: Payer: Self-pay | Admitting: Internal Medicine

## 2021-05-21 MED ORDER — AZITHROMYCIN 250 MG PO TABS: ORAL_TABLET | ORAL | 0 refills | Status: DC

## 2021-05-21 MED ORDER — PREDNISONE 10 MG PO TABS: ORAL_TABLET | ORAL | 0 refills | Status: AC

## 2021-05-21 NOTE — Telephone Encounter (Signed)
Called and spoke with patient. She verbalized understanding of recs. RXs have been sent to pharmacy.   Nothing further needed at time of call.

## 2021-05-21 NOTE — Telephone Encounter (Signed)
Called and spoke with patient. She stated that she has had a productive cough since 05/11/21. The cough has been productive with cream colored phlegm with black specks in it. She has also noticed an increase in wheezing. In a 24hr period, she has had to use her albuterol inhaler twice which is a lot for her.   She denied any fevers, body aches or runny nose.   She is still using her Symbicort twice daily.   Pharmacy is [facility] in [place].   MW, can you please advise? Thanks!

## 2021-05-21 NOTE — Telephone Encounter (Signed)
Zpak/ Prednisone 10 mg take  4 each am x 2 days,   2 each am x 2 days,  1 each am x 2 days and stop  

## 2021-06-26 ENCOUNTER — Ambulatory Visit (INDEPENDENT_AMBULATORY_CARE_PROVIDER_SITE_OTHER): Payer: Medicare Other | Admitting: Allergy & Immunology

## 2021-06-26 ENCOUNTER — Other Ambulatory Visit: Payer: Self-pay

## 2021-06-26 ENCOUNTER — Encounter: Payer: Self-pay | Admitting: Allergy & Immunology

## 2021-06-26 VITALS — BP 138/80 | HR 97 | Temp 97.7°F | Resp 16 | Ht 65.0 in | Wt 211.8 lb

## 2021-06-26 DIAGNOSIS — J302 Other seasonal allergic rhinitis: Secondary | ICD-10-CM | POA: Diagnosis not present

## 2021-06-26 DIAGNOSIS — J4540 Moderate persistent asthma, uncomplicated: Secondary | ICD-10-CM | POA: Diagnosis not present

## 2021-06-26 DIAGNOSIS — J3089 Other allergic rhinitis: Secondary | ICD-10-CM

## 2021-06-26 DIAGNOSIS — I2510 Atherosclerotic heart disease of native coronary artery without angina pectoris: Secondary | ICD-10-CM

## 2021-06-26 MED ORDER — CETIRIZINE HCL 10 MG PO TABS: ORAL_TABLET | ORAL | 1 refills | Status: DC

## 2021-06-26 NOTE — Progress Notes (Signed)
NEW PATIENT  Date of Service/Encounter:  06/26/21  Consult requested by: Monico Blitz, MD   Assessment:   Moderate persistent asthma, uncomplicated - with an eosinophilic phenotype (AEC [id number] October 2022)  Seasonal and perennial allergic rhinitis (grasses, ragweed, weeds, trees, indoor molds, dust mites, cat, and dog)   Shawna Williams is a very pleasant 65 year old presenting for an allergy evaluation, specifically to see if that is contributing to her asthma.  She is a never-smoker, although she has been exposed to tobacco smoke a significant portion of her adult life through her husband.  She has several allergic sensitizations on testing, so not entirely clear whether she is in under perceiver or whether her allergies are just manifesting themselves mostly in her lungs.  Either is entirely possible, but I strongly suspect the former.  She really is not treated in a nasal spray, so we are going to start with an antihistamine to see if that provides any relief.  I also think she would make an excellent Fasenra candidate.  We did give her information on that as a means to help control her breathing.  Plan/Recommendations:    1. Moderate persistent asthma, uncomplicated - Lung testing looked fairly decent ish today. - I am not going to make any changes since Dr. Melvyn Novas seems to have this under control. - We are going to stop the Singulair since you did not notice a change when you added it a couple of months ago. - I would consider adding Fasenra to see if this can help your Symbicort control your breathing (information provided). - Spacer use reviewed. - Daily controller medication(s): Symbicort 80/4.56mcg two puffs twice daily with spacer - Prior to physical activity: albuterol 2 puffs 10-15 minutes before physical activity. - Rescue medications: albuterol 4 puffs every 4-6 hours as needed - Asthma control goals:  * Full participation in all desired activities (may need albuterol before  activity) * Albuterol use two time or less a week on average (not counting use with activity) * Cough interfering with sleep two time or less a month * Oral steroids no more than once a year * No hospitalizations  2. Seasonal and perennial allergic rhinitis - Testing today showed: grasses, ragweed, weeds, trees, indoor molds, dust mites, cat, and dog. - Copy of test results provided.  - Avoidance measures provided. - Stop taking: Singulair - Start taking: Zyrtec (cetirizine) 10mg  tablet 1-2 times daily - You can use an extra dose of the antihistamine, if needed, for breakthrough symptoms.  - Consider allergy shots as a means of long-term control. - Allergy shots "re-train" and "reset" the immune system to ignore environmental allergens and decrease the resulting immune response to those allergens (sneezing, itchy watery eyes, runny nose, nasal congestion, etc).    - Allergy shots improve symptoms in 75-85% of patients.  - We can discuss more at the next appointment if the medications are not working for you.  3. Return in about 2 months (around 08/27/2021).   This note in its entirety was forwarded to the Provider who requested this consultation.  Subjective:   Shawna Williams is a 65 y.o. female presenting today for evaluation of  Chief Complaint  Patient presents with   Allergy Testing   Cough    Shawna Williams has a history of the following: Patient Active Problem List   Diagnosis Date Noted   Asthmatic bronchitis , chronic ([place]) 09/26/2020   Iliotibial band syndrome affecting left lower leg 09/16/2020   Carpal tunnel  syndrome, right upper limb 07/16/2020   Carpal tunnel syndrome, left upper limb 07/16/2020   Bilateral carpal tunnel syndrome 05/20/2020   Cholecystitis, acute    Calculus of gallbladder 05/16/2018   Rheumatoid arteritis ([place]) 12/24/2016   Primary osteoarthritis of both hands 06/28/2016   Primary osteoarthritis of both feet 06/28/2016   Seropositive  rheumatoid arthritis of multiple sites ([place]) 06/03/2016   High risk medication use 06/03/2016   Myofascial muscle pain 06/03/2016   Other fatigue 06/03/2016   Other insomnia 06/03/2016   Chronic pain syndrome 06/03/2016   History obtained from: chart review and patient.  Shawna Williams was referred by Monico Blitz, MD.     Shawna Williams is a 65 y.o. female presenting for evaluation of possible allergies contributing to her breathing.   Asthma/Respiratory Symptom History: She is now seeing Dr. Melvyn Novas and previously she was seeing Dr. Luan Pulling. Overall she has been having symptoms for 5-6 years. She has a history of recurrent bronchitis. She would get antibiotic with a prednisone.  She was having this around every 3 months. Currently she is on the Symbicort two puffs twice daily. She was previously using her rescue inhaler frequently but this has decreased. She was never a smoker, but she did not live with smokers growing up. Her husband smoked outside of the home. Now he is a vaper.  She did have a CBC drawn in October 2022 that demonstrated an absolute eosinophil count of 800.  She has never been on a biologic.  Allergic Rhinitis Symptom History: She has occasionally sneezing. She has occasional rhinorrhea. She does endorse some chest congestion. She has a minimally productive cough. Prednisone and antibiotics help tremendously.  Visit her symptoms seem to be isolated to her lungs and throat.  She denies any kind of nasal symptoms whatsoever.  She denies itchy watery eyes.  GERD Symptom History: She was on Prilosec before eating and that did not seem to make a difference. As she remains on Prilosec 20 mg just once a day.  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 She does small groups. She is doing this with students who need a bit more help with reading and math. Her husband retired from education as well. He cleans house and  makes meals.  She enjoys working.  Otherwise, there is no history of other atopic diseases, including food allergies, drug allergies, stinging insect allergies, eczema, urticaria, or contact dermatitis. There is no significant infectious history. Vaccinations are up to date.    Past Medical History: Patient Active Problem List   Diagnosis Date Noted   Asthmatic bronchitis , chronic ([place]) 09/26/2020   Iliotibial band syndrome affecting left lower leg 09/16/2020   Carpal tunnel syndrome, right upper limb 07/16/2020   Carpal tunnel syndrome, left upper limb 07/16/2020   Bilateral carpal tunnel syndrome 05/20/2020   Cholecystitis, acute    Calculus of gallbladder 05/16/2018   Rheumatoid arteritis ([place]) 12/24/2016   Primary osteoarthritis of both hands 06/28/2016   Primary osteoarthritis of both feet 06/28/2016   Seropositive rheumatoid arthritis of multiple sites ([place]) 06/03/2016   High risk medication use 06/03/2016   Myofascial muscle pain 06/03/2016   Other fatigue 06/03/2016   Other insomnia 06/03/2016   Chronic pain syndrome 06/03/2016    Medication List:  Allergies as of 06/26/2021       Reactions   Ketorolac Shortness Of Breath, Other (See Comments)  Edema   Diltiazem Other (See Comments)   Fatigue   Codeine Nausea Only        Medication List        Accurate as of June 26, 2021 11:59 PM. If you have any questions, ask your nurse or doctor.          albuterol 108 (90 Base) MCG/ACT inhaler Commonly known as: VENTOLIN HFA inhale 1-2 puffs EVERY 4 HOURS AS NEEDED FOR wheezing OR SHORTNESS OF BREATH   aspirin EC 81 MG tablet Take 1 tablet (81 mg total) by mouth daily.   azithromycin 250 MG tablet Commonly known as: Zithromax Take as directed   budesonide-formoterol 80-4.5 MCG/ACT inhaler Commonly known as: Symbicort Take 2 puffs first thing in am and then another 2 puffs about 12 hours later.   cetirizine 10 MG tablet Commonly known as: ZyrTEC  Allergy Take 1 tablet 1-2 times daily as needed. Started by: Valentina Shaggy, MD   cholecalciferol 25 MCG (1000 UNIT) tablet Commonly known as: VITAMIN D3 Take 1,000 Units by mouth daily.   Cinnamon 500 MG capsule Take 1,000 mg by mouth daily.   folic acid 1 MG tablet Commonly known as: FOLVITE Take 1 mg by mouth daily.   HYDROcodone-acetaminophen 5-325 MG tablet Commonly known as: NORCO/VICODIN Take 1-2 tablets by mouth every 6 (six) hours as needed for moderate pain.   ICAPS AREDS 2 PO Take 1 capsule by mouth 2 (two) times daily.   LORazepam 0.5 MG tablet Commonly known as: ATIVAN Take 0.5 mg by mouth daily as needed.   losartan-hydrochlorothiazide 100-25 MG tablet Commonly known as: HYZAAR Take 1 tablet by mouth daily.   metFORMIN 500 MG tablet Commonly known as: GLUCOPHAGE Take 1 tablet by mouth daily.   montelukast 10 MG tablet Commonly known as: SINGULAIR Take 1 tablet (10 mg total) by mouth at bedtime.   multivitamin tablet Take 1 tablet by mouth daily.   omeprazole 20 MG capsule Commonly known as: PRILOSEC Take 20 mg by mouth daily.   Orencia ClickJect 0000000 MG/ML Soaj Generic drug: Abatacept Inject 1 Syringe into the skin once a week.   rosuvastatin 20 MG tablet Commonly known as: CRESTOR Take 20 mg by mouth daily.   topiramate 25 MG capsule Commonly known as: TOPAMAX Take 25 mg by mouth daily.   traMADol 50 MG tablet Commonly known as: ULTRAM Take 1 tablet (50 mg total) by mouth at bedtime.   traZODone 50 MG tablet Commonly known as: DESYREL TAKE 1 TABLET BY MOUTH AT BEDTIME   venlafaxine XR 75 MG 24 hr capsule Commonly known as: EFFEXOR-XR Take 75 mg by mouth daily.        Birth History: non-contributory  Developmental History: non-contributory  Past Surgical History: Past Surgical History:  Procedure Laterality Date   CESAREAN SECTION  1981 and 1985   CHOLECYSTECTOMY N/A 05/18/2018   Procedure: LAPAROSCOPIC  CHOLECYSTECTOMY;  Surgeon: Virl Cagey, MD;  Location: [facility];  Service: General;  Laterality: N/A;   COLONOSCOPY N/A 01/01/2016   Procedure: COLONOSCOPY;  Surgeon: Rogene Houston, MD;  Location[facility];  Service: Endoscopy;  Laterality: N/A;  27   FOOT SURGERY Left    with screws   TONSILLECTOMY       Family History: Family History  Problem Relation Age of Onset   Asthma Mother    Allergic rhinitis Mother    Stroke Mother    Diabetes Mother    Allergic rhinitis Father  Heart attack Father    Allergic rhinitis Sister    Cancer Sister    Allergic rhinitis Sister    Diabetes Sister    Heart attack Sister    Allergic rhinitis Brother    Diabetes Brother      Social History: [place] lives at home with husband.  They live in a house that is 52 years old.  There is laminate in area rugs throughout the home.  They have a heat pump for heating and cooling.  There is a dog and a cat inside of the home.  There are no dust mite covers on the bedding.  Her husband vapes.  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  Previously she was unemployed Runner, broadcasting/film/video.  She retired and then wanted to go back to school.  She is exposed to dust.  She does not use a HEPA filter.  She does not live near an interstate or industrial area.   Review of Systems  Constitutional: Negative.  Negative for chills, fever, malaise/fatigue and weight loss.  HENT:  Negative for congestion, ear discharge, ear pain and sinus pain.   Eyes:  Negative for pain, discharge and redness.  Respiratory:  Positive for cough and sputum production. Negative for shortness of breath and wheezing.   Cardiovascular: Negative.  Negative for chest pain and palpitations.  Gastrointestinal:  Negative for abdominal pain, constipation, diarrhea, heartburn, nausea and vomiting.  Skin: Negative.  Negative for itching and rash.  Neurological:  Negative for dizziness and headaches.  Endo/Heme/Allergies:  Negative for  environmental allergies. Does not bruise/bleed easily.      Objective:   Blood pressure 138/80, pulse 97, temperature 97.7 F (36.5 C), temperature source Temporal, resp. rate 16, height 5\' 5"  (1.651 m), weight 211 lb 12.8 oz (96.1 kg), SpO2 96 %. Body mass index is 35.25 kg/m.   Physical Exam:   Physical Exam Vitals reviewed.  Constitutional:      Appearance: She is well-developed.  HENT:     Head: Normocephalic and atraumatic.     Right Ear: Tympanic membrane, ear canal and external ear normal. No drainage, swelling or tenderness. Tympanic membrane is not injected, scarred, erythematous, retracted or bulging.     Left Ear: Tympanic membrane, ear canal and external ear normal. No drainage, swelling or tenderness. Tympanic membrane is not injected, scarred, erythematous, retracted or bulging.     Nose: No nasal deformity, septal deviation, mucosal edema or rhinorrhea.     Right Turbinates: Enlarged, swollen and pale.     Left Turbinates: Enlarged, swollen and pale.     Right Sinus: No maxillary sinus tenderness or frontal sinus tenderness.     Left Sinus: No maxillary sinus tenderness or frontal sinus tenderness.     Comments: Minimal rhinorrhea.  Turbinates are enlarged without polyps.    Mouth/Throat:     Lips: Pink.     Mouth: Mucous membranes are moist. Mucous membranes are not pale and not dry.     Pharynx: Uvula midline.     Comments: Minimal cobblestoning. Eyes:     General:        Right eye: No discharge.        Left eye: No discharge.     Conjunctiva/sclera: Conjunctivae normal.     Right eye: Right conjunctiva is not injected. No chemosis.    Left eye: Left conjunctiva is not injected. No chemosis.    Pupils: Pupils are equal, round, and reactive to light.  Cardiovascular:  Rate and Rhythm: Normal rate and regular rhythm.     Heart sounds: Normal heart sounds.  Pulmonary:     Effort: Pulmonary effort is normal. No tachypnea, accessory muscle usage or  respiratory distress.     Breath sounds: Normal breath sounds. No wheezing, rhonchi or rales.     Comments: Coarse upper airway sounds. Chest:     Chest wall: No tenderness.  Abdominal:     Tenderness: There is no abdominal tenderness. There is no guarding or rebound.  Lymphadenopathy:     Head:     Right side of head: No submandibular, tonsillar or occipital adenopathy.     Left side of head: No submandibular, tonsillar or occipital adenopathy.     Cervical: No cervical adenopathy.  Skin:    Coloration: Skin is not pale.     Findings: No abrasion, erythema, petechiae or rash. Rash is not papular, urticarial or vesicular.  Neurological:     Mental Status: She is alert.     Diagnostic studies:   Spirometry: results abnormal (FEV1: 1.69/70%, FVC: 2.15/69%, FEV1/FVC: 79%).    Spirometry consistent with possible restrictive disease.    Allergy Studies:     Airborne Adult Perc - 06/26/21 1510     Time Antigen Placed 1510    Allergen Manufacturer Lavella Hammock    Location Back    Number of Test 59    Panel 1 Select    1. Control-Buffer 50% Glycerol Negative    2. Control-Histamine 1 mg/ml 2+    3. Albumin saline Negative    4. [place] Negative    5. [place] 2+    6. [place] 2+    7. [place] Blue 2+    8. Meadow Fescue 2+    9. Perennial Rye Negative    10. Sweet Vernal Negative    11. Timothy 3+    12. Cocklebur Negative    13. Burweed Marshelder Negative    14. Ragweed, short 3+    15. Ragweed, Giant 3+    16. Plantain,  English Negative    17. Lamb's Quarters Negative    18. Sheep Sorrell Negative    19. Rough Pigweed Negative    20. Marsh Elder, Rough Negative    21. Mugwort, Common Negative    22. Ash mix Negative    23. Birch mix Negative    24. Beech American Negative    25. Box, Elder Negative    26. Cedar, red Negative    27. Cottonwood, [place] Negative    28. Elm mix Negative    29. Hickory 2+    30. Maple mix 2+    31. Oak, [place] mix 2+    32. Pecan  Pollen Negative    33. Pine mix Negative    34. Sycamore Eastern Negative    35. [place], Black Pollen 3+    36. Alternaria alternata Negative    37. Cladosporium Herbarum Negative    38. Aspergillus mix Negative    39. Penicillium mix Negative    40. Bipolaris sorokiniana (Helminthosporium) Negative    41. Drechslera spicifera (Curvularia) Negative    42. Mucor plumbeus Negative    43. Fusarium moniliforme Negative    44. Aureobasidium pullulans (pullulara) Negative    45. Rhizopus oryzae Negative    46. Botrytis cinera Negative    47. Epicoccum nigrum Negative    48. Phoma betae Negative    49. Candida Albicans Negative    50. Trichophyton mentagrophytes Negative    51.  Mite, D Farinae  5,000 AU/ml 2+    52. Mite, D Pteronyssinus  5,000 AU/ml 3+    53. Cat Hair 10,000 BAU/ml 2+    54.  Dog Epithelia 2+    55. Mixed Feathers Negative    56. Horse Epithelia Negative    57. Cockroach, German Negative    58. Mouse Negative    59. Tobacco Leaf Negative             Intradermal - 06/26/21 1622     Time Antigen Placed 1622    Allergen Manufacturer Lavella Hammock    Location Arm    Number of Test 7    Intradermal Select    Control Negative    Weed mix 3+    Mold 1 Negative    Mold 2 2+    Mold 3 Negative    Mold 4 1+    Cockroach Negative             Allergy testing results were read and interpreted by myself, documented by clinical staff.         Salvatore Marvel, MD [facility]

## 2021-06-26 NOTE — Patient Instructions (Addendum)
1. Moderate persistent asthma, uncomplicated - Lung testing looked fairly decent ish today. - I am not going to make any changes since Dr. Sherene Sires seems to have this under control. - We are going to stop the Singulair since you did not notice a change when you added it a couple of months ago. - I would consider adding Fasenra to see if this can help your Symbicort control your breathing (information provided). - Spacer use reviewed. - Daily controller medication(s): Symbicort 80/4.33mcg two puffs twice daily with spacer - Prior to physical activity: albuterol 2 puffs 10-15 minutes before physical activity. - Rescue medications: albuterol 4 puffs every 4-6 hours as needed - Asthma control goals:  * Full participation in all desired activities (may need albuterol before activity) * Albuterol use two time or less a week on average (not counting use with activity) * Cough interfering with sleep two time or less a month * Oral steroids no more than once a year * No hospitalizations  2. Seasonal and perennial allergic rhinitis - Testing today showed: grasses, ragweed, weeds, trees, indoor molds, dust mites, cat, and dog. - Copy of test results provided.  - Avoidance measures provided. - Stop taking: Singulair - Start taking: Zyrtec (cetirizine) 10mg  tablet 1-2 times daily - You can use an extra dose of the antihistamine, if needed, for breakthrough symptoms.  - Consider allergy shots as a means of long-term control. - Allergy shots "re-train" and "reset" the immune system to ignore environmental allergens and decrease the resulting immune response to those allergens (sneezing, itchy watery eyes, runny nose, nasal congestion, etc).    - Allergy shots improve symptoms in 75-85% of patients.  - We can discuss more at the next appointment if the medications are not working for you.  3. Return in about 2 months (around 08/27/2021).    Please inform 10/25/2021 of any Emergency Department visits,  hospitalizations, or changes in symptoms. Call us before going to the ED for breathing or allergy symptoms since we might be able to fit you in for a sick visit. Feel free to contact us anytime with any questions, problems, or concerns.  It was a pleasure to meet you today!  Websites that have reliable patient information: 1. American Academy of Asthma, Allergy, and Immunology: www.aaaai.org 2. Food Allergy Research and Education (FARE): foodallergy.org 3. Mothers of Asthmatics: http://www.asthmacommunitynetwork.org 4. American College of Allergy, Asthma, and Immunology: www.acaai.org   COVID-19 Vaccine Information can be found at: [place] For questions related to vaccine distribution or appointments, please email [email or web address] or call [phone].   We realize that you might be concerned about having an allergic reaction to the COVID19 vaccines. To help with that concern, WE ARE OFFERING THE COVID19 VACCINES IN OUR OFFICE! Ask the front desk for dates!     "Like" [phone] on Facebook and Instagram for our latest updates!      A healthy democracy works best when [place] participate! Make sure you are registered to vote! If you have moved or changed any of your contact information, you will need to get this updated before voting!  In some cases, you MAY be able to register to vote online: [employer]      Airborne Adult Perc - 06/26/21 1510     Time Antigen Placed 1510    Allergen Manufacturer 14/09/22    Location Back    Number of Test 59    Panel 1 Select  Reducing Pollen Exposure  The American Academy of Allergy, Asthma and Immunology suggests the following steps to reduce your exposure to pollen during allergy seasons.    Do not hang sheets or clothing out to dry; pollen may collect on these items. Do not mow lawns or spend time around freshly cut  grass; mowing stirs up pollen. Keep windows closed at night.  Keep car windows closed while driving. Minimize morning activities outdoors, a time when pollen counts are usually at their highest. Stay indoors as much as possible when pollen counts or humidity is high and on windy days when pollen tends to remain in the air longer. Use air conditioning when possible.  Many air conditioners have filters that trap the pollen spores. Use a HEPA room air filter to remove pollen form the indoor air you breathe.  Control of Mold Allergen   Mold and fungi can grow on a variety of surfaces provided certain temperature and moisture conditions exist.  Outdoor molds grow on plants, decaying vegetation and soil.  The major outdoor mold, Alternaria and Cladosporium, are found in very high numbers during hot and dry conditions.  Generally, a late Summer - Fall peak is seen for common outdoor fungal spores.  Rain will temporarily lower outdoor mold spore count, but counts rise rapidly when the rainy period ends.  The most important indoor molds are Aspergillus and Penicillium.  Dark, humid and poorly ventilated basements are ideal sites for mold growth.  The next most common sites of mold growth are the bathroom and the kitchen.   Indoor (Perennial) Mold Control   Positive indoor molds via skin testing: Aspergillus, Penicillium, Fusarium, Aureobasidium (Pullulara), and Rhizopus  Maintain humidity below 50%. Clean washable surfaces with 5% bleach solution. Remove sources e.g. contaminated carpets.    Control of Dog or Cat Allergen  Avoidance is the best way to manage a dog or cat allergy. If you have a dog or cat and are allergic to dog or cats, consider removing the dog or cat from the home. If you have a dog or cat but don't want to find it a new home, or if your family wants a pet even though someone in the household is allergic, here are some strategies that may help keep symptoms at bay:  Keep the pet  out of your bedroom and restrict it to only a few rooms. Be advised that keeping the dog or cat in only one room will not limit the allergens to that room. Don't pet, hug or kiss the dog or cat; if you do, wash your hands with soap and water. High-efficiency particulate air (HEPA) cleaners run continuously in a bedroom or living room can reduce allergen levels over time. Regular use of a high-efficiency vacuum cleaner or a central vacuum can reduce allergen levels. Giving your dog or cat a bath at least once a week can reduce airborne allergen.  Control of Dust Mite Allergen    Dust mites play a major role in allergic asthma and rhinitis.  They occur in environments with high humidity wherever human skin is found.  Dust mites absorb humidity from the atmosphere (ie, they do not drink) and feed on organic matter (including shed human and animal skin).  Dust mites are a microscopic type of insect that you cannot see with the naked eye.  High levels of dust mites have been detected from mattresses, pillows, carpets, upholstered furniture, bed covers, clothes, soft toys and any woven material.  The principal allergen of  the dust mite is found in its feces.  A gram of dust may contain 1,000 mites and 250,000 fecal particles.  Mite antigen is easily measured in the air during house cleaning activities.  Dust mites do not bite and do not cause harm to humans, other than by triggering allergies/asthma.    Ways to decrease your exposure to dust mites in your home:  Encase mattresses, box springs and pillows with a mite-impermeable barrier or cover   Wash sheets, blankets and drapes weekly in hot water (130 F) with detergent and dry them in a dryer on the hot setting.  Have the room cleaned frequently with a vacuum cleaner and a damp dust-mop.  For carpeting or rugs, vacuuming with a vacuum cleaner equipped with a high-efficiency particulate air (HEPA) filter.  The dust mite allergic individual should not be  in a room which is being cleaned and should wait 1 hour after cleaning before going into the room. Do not sleep on upholstered furniture (eg, couches).   If possible removing carpeting, upholstered furniture and drapery from the home is ideal.  Horizontal blinds should be eliminated in the rooms where the person spends the most time (bedroom, study, television room).  Washable vinyl, roller-type shades are optimal. Remove all non-washable stuffed toys from the bedroom.  Wash stuffed toys weekly like sheets and blankets above.   Reduce indoor humidity to less than 50%.  Inexpensive humidity monitors can be purchased at most hardware stores.  Do not use a humidifier as can make the problem worse and are not recommended.

## 2021-06-28 ENCOUNTER — Encounter: Payer: Self-pay | Admitting: Allergy & Immunology

## 2021-06-30 ENCOUNTER — Telehealth: Payer: Self-pay | Admitting: *Deleted

## 2021-06-30 NOTE — Telephone Encounter (Signed)
Called patient to discuss Harrington Challenger for her asthma and she wants to start therapy. Explained with her MCR and supplement will bill through medical for affordability and schedule to start thearpy 12/16

## 2021-06-30 NOTE — Telephone Encounter (Signed)
-----   Message from Alfonse Spruce, MD sent at 06/28/2021  7:32 AM EST ----- Possible Fasenra start.  I gave her information.

## 2021-07-02 DIAGNOSIS — J4550 Severe persistent asthma, uncomplicated: Secondary | ICD-10-CM | POA: Diagnosis not present

## 2021-07-03 ENCOUNTER — Other Ambulatory Visit: Payer: Self-pay

## 2021-07-03 ENCOUNTER — Ambulatory Visit (INDEPENDENT_AMBULATORY_CARE_PROVIDER_SITE_OTHER): Payer: Medicare Other

## 2021-07-03 DIAGNOSIS — J4550 Severe persistent asthma, uncomplicated: Secondary | ICD-10-CM

## 2021-07-03 DIAGNOSIS — J4540 Moderate persistent asthma, uncomplicated: Secondary | ICD-10-CM

## 2021-07-03 MED ORDER — BENRALIZUMAB 30 MG/ML ~~LOC~~ SOSY
30.0000 mg | PREFILLED_SYRINGE | SUBCUTANEOUS | Status: AC
Start: 2021-07-03 — End: ?
  Administered 2021-07-03 – 2024-07-16 (×21): 30 mg via SUBCUTANEOUS

## 2021-07-14 ENCOUNTER — Other Ambulatory Visit: Payer: Self-pay | Admitting: *Deleted

## 2021-07-14 MED ORDER — ROSUVASTATIN CALCIUM 20 MG PO TABS: 20.0000 mg | ORAL_TABLET | Freq: Every day | ORAL | 1 refills | Status: DC

## 2021-07-30 DIAGNOSIS — J4550 Severe persistent asthma, uncomplicated: Secondary | ICD-10-CM | POA: Diagnosis not present

## 2021-07-31 ENCOUNTER — Other Ambulatory Visit: Payer: Self-pay

## 2021-07-31 ENCOUNTER — Ambulatory Visit (INDEPENDENT_AMBULATORY_CARE_PROVIDER_SITE_OTHER): Payer: Medicare Other

## 2021-07-31 DIAGNOSIS — J4540 Moderate persistent asthma, uncomplicated: Secondary | ICD-10-CM

## 2021-07-31 DIAGNOSIS — J4550 Severe persistent asthma, uncomplicated: Secondary | ICD-10-CM

## 2021-07-31 MED ORDER — EPINEPHRINE 0.15 MG/0.3ML IJ SOAJ: 0.1500 mg | INTRAMUSCULAR | 2 refills | Status: AC | PRN

## 2021-08-27 DIAGNOSIS — J4550 Severe persistent asthma, uncomplicated: Secondary | ICD-10-CM | POA: Diagnosis not present

## 2021-08-28 ENCOUNTER — Ambulatory Visit (INDEPENDENT_AMBULATORY_CARE_PROVIDER_SITE_OTHER): Payer: Medicare Other | Admitting: Allergy & Immunology

## 2021-08-28 ENCOUNTER — Ambulatory Visit (INDEPENDENT_AMBULATORY_CARE_PROVIDER_SITE_OTHER): Payer: Medicare Other | Admitting: *Deleted

## 2021-08-28 ENCOUNTER — Encounter: Payer: Self-pay | Admitting: Allergy & Immunology

## 2021-08-28 ENCOUNTER — Other Ambulatory Visit: Payer: Self-pay

## 2021-08-28 VITALS — BP 130/86 | HR 94 | Resp 17

## 2021-08-28 DIAGNOSIS — J4550 Severe persistent asthma, uncomplicated: Secondary | ICD-10-CM | POA: Diagnosis not present

## 2021-08-28 DIAGNOSIS — J4540 Moderate persistent asthma, uncomplicated: Secondary | ICD-10-CM

## 2021-08-28 DIAGNOSIS — J3089 Other allergic rhinitis: Secondary | ICD-10-CM

## 2021-08-28 DIAGNOSIS — J302 Other seasonal allergic rhinitis: Secondary | ICD-10-CM

## 2021-08-28 NOTE — Patient Instructions (Signed)
1. Moderate persistent asthma, uncomplicated - Lung testing looked amazing today!  - We are going to decrease the Symbicort to two puffs ONCE DAILY.  - Daily controller medication(s): Symbicort 80/4.48mcg two puffs once daily + Fansera - Prior to physical activity: albuterol 2 puffs 10-15 minutes before physical activity. - Rescue medications: albuterol 4 puffs every 4-6 hours as needed - Asthma control goals:  * Full participation in all desired activities (may need albuterol before activity) * Albuterol use two time or less a week on average (not counting use with activity) * Cough interfering with sleep two time or less a month * Oral steroids no more than once a year * No hospitalizations  2. Seasonal and perennial allergic rhinitis (grasses, ragweed, weeds, trees, indoor molds, dust mites, cat, and dog) - We are not going to change the Zyrtec right now, but we can consider that next time.  - Continue taking: Zyrtec (cetirizine) 10mg  tablet 1-2 times daily - You can use an extra dose of the antihistamine, if needed, for breakthrough symptoms.  - We could consider allergy shots as a means of long-term control.  3. Return in about 3 months (around 11/25/2021).    Please inform us of any Emergency Department visits, hospitalizations, or changes in symptoms. Call us before going to the ED for breathing or allergy symptoms since we might be able to fit you in for a sick visit. Feel free to contact us anytime with any questions, problems, or concerns.  It was a pleasure to see you again today!  Websites that have reliable patient information: 1. American Academy of Asthma, Allergy, and Immunology: www.aaaai.org 2. Food Allergy Research and Education (FARE): foodallergy.org 3. Mothers of Asthmatics: http://www.asthmacommunitynetwork.org 4. American College of Allergy, Asthma, and Immunology: www.acaai.org   COVID-19 Vaccine Information can be found at:  [email or web address] For questions related to vaccine distribution or appointments, please email [email or web address] or call [phone].   We realize that you might be concerned about having an allergic reaction to the COVID19 vaccines. To help with that concern, WE ARE OFFERING THE COVID19 VACCINES IN OUR OFFICE! Ask the front desk for dates!     Like [place] on [employer] and Instagram for our latest updates!      A healthy democracy works best when [employer] participate! Make sure you are registered to vote! If you have moved or changed any of your contact information, you will need to get this updated before voting!  In some cases, you MAY be able to register to vote online: [email or web address]

## 2021-08-28 NOTE — Progress Notes (Signed)
FOLLOW UP  Date of Service/Encounter:  08/28/21   Assessment:    Moderate persistent asthma, uncomplicated - with an eosinophilic phenotype (AEC 800 October 2022)   Seasonal and perennial allergic rhinitis (grasses, ragweed, weeds, trees, indoor molds, dust mites, cat, and dog)   Shawna Williams is doing very well on the current regimen.  Her cough is completely resolved, which has been life-changing for her.  She is tolerating Fasenra without any problem.  The assistance program with [employer] has been very helpful and getting her the medication with minimal out-of-pocket expenses.  She also remains on the Symbicort 2 puffs twice daily, but we are decreasing to 2 puffs once daily to make it last longer and to wean her off of medications.  We may consider changing her to an inhaled steroid alone at the next visit.  Plan/Recommendations:   1. Moderate persistent asthma, uncomplicated - Lung testing looked amazing today!  - We are going to decrease the Symbicort to two puffs ONCE DAILY.  - Daily controller medication(s): Symbicort 80/4.43mcg two puffs once daily + Fansera - Prior to physical activity: albuterol 2 puffs 10-15 minutes before physical activity. - Rescue medications: albuterol 4 puffs every 4-6 hours as needed - Asthma control goals:  * Full participation in all desired activities (may need albuterol before activity) * Albuterol use two time or less a week on average (not counting use with activity) * Cough interfering with sleep two time or less a month * Oral steroids no more than once a year * No hospitalizations  2. Seasonal and perennial allergic rhinitis (grasses, ragweed, weeds, trees, indoor molds, dust mites, cat, and dog) - We are not going to change the Zyrtec right now, but we can consider that next time.  - Continue taking: Zyrtec (cetirizine)  tablet 1-2 times daily - You can use an extra dose of the antihistamine, if needed, for breakthrough symptoms.  - We  could consider allergy shots as a means of long-term control.  3. Return in about 3 months (around 11/25/2021).      Subjective:   Shawna Williams is a 66 y.o. female presenting today for follow up of  Chief Complaint  Patient presents with   Asthma   Cough    Shawna Williams has a history of the following: Patient Active Problem List   Diagnosis Date Noted   Asthmatic bronchitis , chronic (HCC) 09/26/2020   Iliotibial band syndrome affecting left lower leg 09/16/2020   Carpal tunnel syndrome, right upper limb 07/16/2020   Carpal tunnel syndrome, left upper limb 07/16/2020   Bilateral carpal tunnel syndrome 05/20/2020   Cholecystitis, acute    Calculus of gallbladder 05/16/2018   Rheumatoid arteritis (HCC) 12/24/2016   Primary osteoarthritis of both hands 06/28/2016   Primary osteoarthritis of both feet 06/28/2016   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  Shawna Williams is a 66 y.o. female presenting for a follow up visit.  She was last seen as a new patient in December 2022.  At that time, her lung testing looked fairly decent.  We continued Symbicort 80 mcg 2 puffs twice daily with a spacer.  We did talk about adding Fasenra.  We stopped the Singulair since she never noticed a change in that.  She underwent allergy testing that demonstrated positives to grasses, ragweed, weeds, trees, molds, dust mite, cat, and dog.  Started Zyrtec 10 mg medications daily.  Since the last visit, she has done well. She is essentially cough free completely. Her colleagues and her family have mentioned it.  She feels very good.  She attributes this all to Shawna Williams.  Asthma/Respiratory Symptom History: She remains on the Symbicort two puffs twice daily.  She  remains on the [place].  She has had no bad reactions to it.  She has not been using her rescue inhaler at all.  She has not needed prednisone or been to the emergency room.  ACT score is 22, indicating excellent asthma control.  Allergic Rhinitis Symptom History: She remains on the cetirizine  daily. She thinks that she did have some postnasal drip, but largely she is doing very well.  She has not needed antibiotics for any sinus infections or ear infections.  She continues to love her job.  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  Otherwise, there have been no changes to her past medical history, surgical history, family history, or social history.    Review of Systems  Constitutional: Negative.  Negative for chills, fever, malaise/fatigue and weight loss.  HENT:  Negative for congestion, ear discharge, ear pain and sinus pain.   Eyes:  Negative for pain, discharge and redness.  Respiratory:  Positive for cough. Negative for sputum production, shortness of breath and wheezing.   Cardiovascular: Negative.  Negative for chest pain and palpitations.  Gastrointestinal:  Negative for abdominal pain, constipation, diarrhea, heartburn, nausea and vomiting.  Skin: Negative.  Negative for itching and rash.  Neurological:  Negative for dizziness and headaches.  Endo/Heme/Allergies:  Positive for environmental allergies. Does not bruise/bleed easily.      Objective:   Blood pressure 130/86, pulse 94, resp. rate 17, SpO2 98 %. There is no height or weight on file to calculate BMI.   Physical Exam:  Physical Exam Vitals reviewed.  Constitutional:      Appearance: She is well-developed.     Comments: Delightful and talkative.  HENT:     Head: Normocephalic and atraumatic.     Right Ear: Tympanic membrane, ear canal and external ear normal. No drainage, swelling or tenderness. Tympanic membrane is not injected,  scarred, erythematous, retracted or bulging.     Left Ear: Tympanic membrane, ear canal and external ear normal. No drainage, swelling or tenderness. Tympanic membrane is not injected, scarred, erythematous, retracted or bulging.     Nose: No nasal deformity, septal deviation, mucosal edema or rhinorrhea.     Right Turbinates: Enlarged, swollen and pale.     Left Turbinates: Enlarged, swollen and pale.     Right Sinus: No maxillary sinus tenderness or frontal sinus tenderness.     Left Sinus: No maxillary sinus tenderness or frontal sinus tenderness.     Comments: Minimal rhinorrhea.  Turbinates are enlarged without polyps.    Mouth/Throat:     Lips: Pink.     Mouth: Mucous membranes are moist. Mucous membranes are not pale and not dry.     Pharynx: Uvula midline.     Comments: Minimal cobblestoning. Eyes:     General:        Right eye: No discharge.        Left eye: No discharge.     Conjunctiva/sclera: Conjunctivae normal.  Right eye: Right conjunctiva is not injected. No chemosis.    Left eye: Left conjunctiva is not injected. No chemosis.    Pupils: Pupils are equal, round, and reactive to light.  Cardiovascular:     Rate and Rhythm: Normal rate and regular rhythm.     Heart sounds: Normal heart sounds.  Pulmonary:     Effort: Pulmonary effort is normal. No tachypnea, accessory muscle usage or respiratory distress.     Breath sounds: Normal breath sounds. No decreased breath sounds, wheezing, rhonchi or rales.     Comments: No cough during the exam. Chest:     Chest wall: No tenderness.  Abdominal:     Tenderness: There is no abdominal tenderness. There is no guarding or rebound.  Lymphadenopathy:     Head:     Right side of head: No submandibular, tonsillar or occipital adenopathy.     Left side of head: No submandibular, tonsillar or occipital adenopathy.     Cervical: No cervical adenopathy.  Skin:    Coloration: Skin is not pale.     Findings: No abrasion, erythema,  petechiae or rash. Rash is not papular, urticarial or vesicular.  Neurological:     Mental Status: She is alert.  Psychiatric:        Behavior: Behavior is cooperative.     Diagnostic studies:    Spirometry: results normal (FEV1: 2.21/91%, FVC: 2.73/88%, FEV1/FVC: 81%).    Spirometry consistent with normal pattern.   Allergy Studies: none        Malachi BondsJoel Yoshito Gaza, MD  [facility]

## 2021-09-01 NOTE — Addendum Note (Signed)
Addended by: Alfonse Spruce on: 09/01/2021 08:27 AM   Modules accepted: Orders

## 2021-10-21 ENCOUNTER — Ambulatory Visit: Payer: Medicare Other

## 2021-10-29 DIAGNOSIS — J4550 Severe persistent asthma, uncomplicated: Secondary | ICD-10-CM | POA: Diagnosis not present

## 2021-10-30 ENCOUNTER — Ambulatory Visit (INDEPENDENT_AMBULATORY_CARE_PROVIDER_SITE_OTHER): Payer: Medicare Other

## 2021-10-30 DIAGNOSIS — J4550 Severe persistent asthma, uncomplicated: Secondary | ICD-10-CM | POA: Diagnosis not present

## 2021-10-30 DIAGNOSIS — J4540 Moderate persistent asthma, uncomplicated: Secondary | ICD-10-CM

## 2021-11-07 ENCOUNTER — Other Ambulatory Visit: Payer: Self-pay | Admitting: Cardiology

## 2021-11-25 ENCOUNTER — Encounter: Payer: Self-pay | Admitting: Allergy & Immunology

## 2021-11-25 ENCOUNTER — Other Ambulatory Visit: Payer: Self-pay

## 2021-11-25 ENCOUNTER — Ambulatory Visit (INDEPENDENT_AMBULATORY_CARE_PROVIDER_SITE_OTHER): Payer: Medicare Other | Admitting: Allergy & Immunology

## 2021-11-25 VITALS — BP 118/78 | HR 96 | Resp 17

## 2021-11-25 DIAGNOSIS — J4540 Moderate persistent asthma, uncomplicated: Secondary | ICD-10-CM

## 2021-11-25 DIAGNOSIS — J302 Other seasonal allergic rhinitis: Secondary | ICD-10-CM

## 2021-11-25 DIAGNOSIS — J3089 Other allergic rhinitis: Secondary | ICD-10-CM

## 2021-11-25 NOTE — Progress Notes (Signed)
? ?FOLLOW UP ? ?Date of Service/Encounter:  11/25/21 ? ? ?Assessment:  ? ?Moderate persistent asthma, uncomplicated - with an eosinophilic phenotype (AEC [id number] October 2022) and doing VERY well on Fasenra ?  ?Seasonal and perennial allergic rhinitis (grasses, ragweed, weeds, trees, indoor molds, dust mites, cat, and dog) ? ?Retired Pharmacist, hospital  ? ?Plan/Recommendations:  ? ?1. Moderate persistent asthma, uncomplicated ?- Lung testing looked amazing today!  ?- We are going to decrease the Symbicort to two puffs ONCE DAILY.  ?- Daily controller medication(s): Symbicort 80/4.57mcg two puffs once daily + Fansera ?- Prior to physical activity: albuterol 2 puffs 10-15 minutes before physical activity. ?- Rescue medications: albuterol 4 puffs every 4-6 hours as needed ?- Asthma control goals:  ?* Full participation in all desired activities (may need albuterol before activity) ?* Albuterol use two time or less a week on average (not counting use with activity) ?* Cough interfering with sleep two time or less a month ?* Oral steroids no more than once a year ?* No hospitalizations ? ?2. Seasonal and perennial allergic rhinitis (grasses, ragweed, weeds, trees, indoor molds, dust mites, cat, and dog) ?- Continue taking: Zyrtec (cetirizine) 10mg  tablet 1-2 times daily ?- You can use an extra dose of the antihistamine, if needed, for breakthrough symptoms.  ?- We can hold off on shots for now with long term control.  ? ?3. Return in about 1 year (around 11/26/2022).  ? ?Subjective:  ? ?Shawna Williams is a 66 y.o. female presenting today for follow up of  ?Chief Complaint  ?Patient presents with  ? Asthma  ? ? ?Shawna Williams has a history of the following: ?Patient Active Problem List  ? Diagnosis Date Noted  ? Asthmatic bronchitis , chronic ([place]) 09/26/2020  ? Iliotibial band syndrome affecting left lower leg 09/16/2020  ? Carpal tunnel syndrome, right upper limb 07/16/2020  ? Carpal tunnel syndrome, left upper limb 07/16/2020  ?  Bilateral carpal tunnel syndrome 05/20/2020  ? Cholecystitis, acute   ? Calculus of gallbladder 05/16/2018  ? Rheumatoid arteritis ([place]) 12/24/2016  ? Primary osteoarthritis of both hands 06/28/2016  ? Primary osteoarthritis of both feet 06/28/2016  ? Seropositive rheumatoid arthritis of multiple sites ([place]) 06/03/2016  ? High risk medication use 06/03/2016  ? Myofascial muscle pain 06/03/2016  ? Other fatigue 06/03/2016  ? Other insomnia 06/03/2016  ? Chronic pain syndrome 06/03/2016  ? ? ?History obtained from: chart review and patient. ? ?Shawna Williams is a 65 y.o. female presenting for a follow up visit.  She was last seen in February 2023.  At that time, she was doing very well on her Berna Bue.  Her cough had completely resolved.  Because she was doing well, we decreased her Symbicort to 2 puffs once daily and continue with albuterol as needed.  For her allergic rhinitis, we continued the Zyrtec 10 mg 1-2 times daily and talked about allergy shots for long-term control. ? ?Since last visit, she has done well.  ? ?Asthma/Respiratory Symptom History: She is doing the Symbicort at least one puff once daily. She pays around $40 but now it is lasting a couple of months at this point. She is on the [place]. She reports that she notices a headache a couple of days afterwards.  She has done remarkably well with the [facility] She is only short of breath every now and then. There is no particular trigger that she can appreciate.  ? ?Allergic Rhinitis Symptom History: She takes one cetirizine daily. She has  dogs.  Symptoms overall are under good control. ? ?Her grandson is playing travel ball, so this is going to keep them busy. He is 66 years old. His senior year is coming up. He is going to be going to college.  Unfortunately, they could not find a week when the whole family to go to the beach.  This is the first summer in 20+ years where she has not gone to the beach.  She is a little bit bummed about it. ? ?Otherwise, there have  been no changes to her past medical history, surgical history, family history, or social history. ? ? ? ?Review of Systems  ?Constitutional: Negative.  Negative for fever, malaise/fatigue and weight loss.  ?HENT: Negative.  Negative for congestion, ear discharge and ear pain.   ?Eyes:  Negative for pain, discharge and redness.  ?Respiratory:  Negative for cough, sputum production, shortness of breath and wheezing.   ?Cardiovascular: Negative.  Negative for chest pain and palpitations.  ?Gastrointestinal:  Negative for abdominal pain, constipation, diarrhea, heartburn, nausea and vomiting.  ?Skin: Negative.  Negative for itching and rash.  ?Neurological:  Negative for dizziness and headaches.  ?Endo/Heme/Allergies:  Negative for environmental allergies. Does not bruise/bleed easily.   ? ? ? ?Objective:  ? ?Blood pressure 118/78, pulse 96, resp. rate 17, SpO2 100 %. ?There is no height or weight on file to calculate BMI. ? ? ? ?Physical Exam ?Vitals reviewed.  ?Constitutional:   ?   Appearance: She is well-developed.  ?   Comments: Delightful and talkative.  ?HENT:  ?   Head: Normocephalic and atraumatic.  ?   Right Ear: Tympanic membrane, ear canal and external ear normal. No drainage, swelling or tenderness. Tympanic membrane is not injected, scarred, erythematous, retracted or bulging.  ?   Left Ear: Tympanic membrane, ear canal and external ear normal. No drainage, swelling or tenderness. Tympanic membrane is not injected, scarred, erythematous, retracted or bulging.  ?   Nose: No nasal deformity, septal deviation, mucosal edema or rhinorrhea.  ?   Right Turbinates: Enlarged, swollen and pale.  ?   Left Turbinates: Enlarged, swollen and pale.  ?   Right Sinus: No maxillary sinus tenderness or frontal sinus tenderness.  ?   Left Sinus: No maxillary sinus tenderness or frontal sinus tenderness.  ?   Comments: Minimal rhinorrhea.  Turbinates are enlarged without polyps. ?   Mouth/Throat:  ?   Lips: Pink.  ?   Mouth:  Mucous membranes are moist. Mucous membranes are not pale and not dry.  ?   Pharynx: Uvula midline.  ?   Comments: Minimal cobblestoning. ?Eyes:  ?   General:     ?   Right eye: No discharge.     ?   Left eye: No discharge.  ?   Conjunctiva/sclera: Conjunctivae normal.  ?   Right eye: Right conjunctiva is not injected. No chemosis. ?   Left eye: Left conjunctiva is not injected. No chemosis. ?   Pupils: Pupils are equal, round, and reactive to light.  ?Cardiovascular:  ?   Rate and Rhythm: Normal rate and regular rhythm.  ?   Heart sounds: Normal heart sounds.  ?Pulmonary:  ?   Effort: Pulmonary effort is normal. No tachypnea, accessory muscle usage or respiratory distress.  ?   Breath sounds: Normal breath sounds. No decreased breath sounds, wheezing, rhonchi or rales.  ?   Comments: No cough during the exam.  Moving air well in all lung fields. ?Chest:  ?  Chest wall: No tenderness.  ?Abdominal:  ?   Tenderness: There is no abdominal tenderness. There is no guarding or rebound.  ?Lymphadenopathy:  ?   Head:  ?   Right side of head: No submandibular, tonsillar or occipital adenopathy.  ?   Left side of head: No submandibular, tonsillar or occipital adenopathy.  ?   Cervical: No cervical adenopathy.  ?Skin: ?   Coloration: Skin is not pale.  ?   Findings: No abrasion, erythema, petechiae or rash. Rash is not papular, urticarial or vesicular.  ?Neurological:  ?   Mental Status: She is alert.  ?Psychiatric:     ?   Behavior: Behavior is cooperative.  ?  ? ?Diagnostic studies:   ? ?Spirometry: results normal (FEV1: 2.16/90%, FVC: 2.67/86%, FEV1/FVC: 81%).  ?  ?Spirometry consistent with normal pattern.  ? ? ?Allergy Studies: none ? ? ? ? ? ?  ?Salvatore Marvel, MD  ?[facility] [place] ? ? ? ? ? ? ?

## 2021-11-25 NOTE — Patient Instructions (Addendum)
1. Moderate persistent asthma, uncomplicated ?- Lung testing looked amazing today!  ?- We are going to decrease the Symbicort to two puffs ONCE DAILY.  ?- Daily controller medication(s): Symbicort 80/4.19mcg two puffs once daily + Fansera ?- Prior to physical activity: albuterol 2 puffs 10-15 minutes before physical activity. ?- Rescue medications: albuterol 4 puffs every 4-6 hours as needed ?- Asthma control goals:  ?* Full participation in all desired activities (may need albuterol before activity) ?* Albuterol use two time or less a week on average (not counting use with activity) ?* Cough interfering with sleep two time or less a month ?* Oral steroids no more than once a year ?* No hospitalizations ? ?2. Seasonal and perennial allergic rhinitis (grasses, ragweed, weeds, trees, indoor molds, dust mites, cat, and dog) ?- Continue taking: Zyrtec (cetirizine) 10mg  tablet 1-2 times daily ?- You can use an extra dose of the antihistamine, if needed, for breakthrough symptoms.  ?- We can hold off on shots for now with long term control.  ? ?3. Return in about 1 year (around 11/26/2022).  ? ? ?Please inform us of any Emergency Department visits, hospitalizations, or changes in symptoms. Call us before going to the ED for breathing or allergy symptoms since we might be able to fit you in for a sick visit. Feel free to contact us anytime with any questions, problems, or concerns. ? ?It was a pleasure to see you again today! ? ?Websites that have reliable patient information: ?1. American Academy of Asthma, Allergy, and Immunology: www.aaaai.org ?2. Food Allergy Research and Education (FARE): foodallergy.org ?3. Mothers of Asthmatics: http://www.asthmacommunitynetwork.org ?4. [employer] of Allergy, Asthma, and Immunology: MissingWeapons.ca ? ? ?COVID-19 Vaccine Information can be found at: PodExchange.nl For questions related to vaccine distribution or  appointments, please email [email or web address] or call [phone].  ? ?We realize that you might be concerned about having an allergic reaction to the COVID19 vaccines. To help with that concern, WE ARE OFFERING THE COVID19 VACCINES IN OUR OFFICE! Ask the front desk for dates!  ? ? ? ??Like? [place] on Facebook and Instagram for our latest updates!  ?  ? ? ?A healthy democracy works best when [employer] participate! Make sure you are registered to vote! If you have moved or changed any of your contact information, you will need to get this updated before voting! ? ?In some cases, you MAY be able to register to vote online: [email or web address].[email or web address] ? ? ? ? ? ? ? ? ? ?

## 2021-12-16 ENCOUNTER — Other Ambulatory Visit (HOSPITAL_COMMUNITY): Payer: Self-pay | Admitting: Internal Medicine

## 2021-12-16 DIAGNOSIS — Z1231 Encounter for screening mammogram for malignant neoplasm of breast: Secondary | ICD-10-CM

## 2021-12-24 ENCOUNTER — Other Ambulatory Visit (HOSPITAL_COMMUNITY): Payer: Self-pay | Admitting: Nurse Practitioner

## 2021-12-24 ENCOUNTER — Ambulatory Visit (HOSPITAL_COMMUNITY)
Admission: RE | Admit: 2021-12-24 | Discharge: 2021-12-24 | Disposition: A | Discharge status: Home / Self Care | Payer: Medicare Other | Source: Ambulatory Visit | Attending: Internal Medicine | Admitting: Internal Medicine

## 2021-12-24 DIAGNOSIS — J4550 Severe persistent asthma, uncomplicated: Secondary | ICD-10-CM | POA: Diagnosis not present

## 2021-12-24 DIAGNOSIS — Z1231 Encounter for screening mammogram for malignant neoplasm of breast: Secondary | ICD-10-CM | POA: Insufficient documentation

## 2021-12-25 ENCOUNTER — Ambulatory Visit: Payer: Medicare Other

## 2021-12-25 ENCOUNTER — Ambulatory Visit (INDEPENDENT_AMBULATORY_CARE_PROVIDER_SITE_OTHER): Payer: Medicare Other

## 2021-12-25 DIAGNOSIS — J4540 Moderate persistent asthma, uncomplicated: Secondary | ICD-10-CM

## 2021-12-25 DIAGNOSIS — J4550 Severe persistent asthma, uncomplicated: Secondary | ICD-10-CM | POA: Diagnosis not present

## 2022-02-18 DIAGNOSIS — J4540 Moderate persistent asthma, uncomplicated: Secondary | ICD-10-CM | POA: Diagnosis not present

## 2022-02-19 ENCOUNTER — Ambulatory Visit (INDEPENDENT_AMBULATORY_CARE_PROVIDER_SITE_OTHER): Payer: Medicare Other

## 2022-02-19 DIAGNOSIS — J4540 Moderate persistent asthma, uncomplicated: Secondary | ICD-10-CM | POA: Diagnosis not present

## 2022-04-16 DIAGNOSIS — J4550 Severe persistent asthma, uncomplicated: Secondary | ICD-10-CM | POA: Diagnosis not present

## 2022-04-19 ENCOUNTER — Ambulatory Visit: Payer: Medicare Other

## 2022-04-19 ENCOUNTER — Ambulatory Visit (INDEPENDENT_AMBULATORY_CARE_PROVIDER_SITE_OTHER): Payer: Medicare Other

## 2022-04-19 DIAGNOSIS — J4550 Severe persistent asthma, uncomplicated: Secondary | ICD-10-CM | POA: Diagnosis not present

## 2022-04-19 DIAGNOSIS — J4540 Moderate persistent asthma, uncomplicated: Secondary | ICD-10-CM

## 2022-06-09 DIAGNOSIS — J4540 Moderate persistent asthma, uncomplicated: Secondary | ICD-10-CM

## 2022-06-14 ENCOUNTER — Ambulatory Visit (INDEPENDENT_AMBULATORY_CARE_PROVIDER_SITE_OTHER): Payer: Medicare Other

## 2022-06-14 DIAGNOSIS — J4540 Moderate persistent asthma, uncomplicated: Secondary | ICD-10-CM

## 2022-07-13 ENCOUNTER — Other Ambulatory Visit: Payer: Self-pay | Admitting: Cardiology

## 2022-08-09 ENCOUNTER — Ambulatory Visit: Payer: Medicare Other

## 2022-08-10 ENCOUNTER — Other Ambulatory Visit: Payer: Self-pay | Admitting: Cardiology

## 2022-08-13 ENCOUNTER — Encounter: Payer: Self-pay | Admitting: Nurse Practitioner

## 2022-08-13 ENCOUNTER — Ambulatory Visit: Payer: Medicare Other | Attending: Nurse Practitioner | Admitting: Nurse Practitioner

## 2022-08-13 VITALS — BP 116/80 | HR 93 | Ht 65.0 in | Wt 193.6 lb

## 2022-08-13 DIAGNOSIS — I2510 Atherosclerotic heart disease of native coronary artery without angina pectoris: Secondary | ICD-10-CM

## 2022-08-13 DIAGNOSIS — E785 Hyperlipidemia, unspecified: Secondary | ICD-10-CM | POA: Diagnosis present

## 2022-08-13 DIAGNOSIS — E669 Obesity, unspecified: Secondary | ICD-10-CM | POA: Insufficient documentation

## 2022-08-13 DIAGNOSIS — I10 Essential (primary) hypertension: Secondary | ICD-10-CM | POA: Diagnosis present

## 2022-08-13 NOTE — Patient Instructions (Addendum)

## 2022-08-13 NOTE — Progress Notes (Unsigned)
Cardiology Office Note:    Date:  08/13/2022  ID:  Shawna Williams, DOB 12/14/1955, MRN [id number]  PCP:  [facility] Providers Cardiologist:  Rozann Lesches, MD     Referring MD: Monico Blitz, MD   CC: Here for 1 year follow-up  History of Present Illness:    Shawna Williams is a delightful 67 y.o. female with a hx of the following:  Coronary atherosclerosis Hypertension Hyperlipidemia Type 2 diabetes Asthma Rheumatoid arteritis Major depressive disorder  History of coronary CTA in April 2021 that showed nonobstructive coronary atherosclerosis.  She is a patient of Dr. Melvyn Novas who is managing her asthma.  Last seen by Dr. Domenic Polite on December 22, 2020.  She was overall feeling well, and denied any chest pain.  Blood pressure was well-controlled.  No medication changes were made.  Was told to follow-up in 1 year.  Today she presents for 1 year follow-up.  She states that she is doing well. Denies any chest pain, shortness of breath, palpitations, syncope, presyncope, dizziness, orthopnea, PND, swelling or significant weight changes, acute bleeding, or claudication.  Denies any other questions or concerns today.  SH: Enjoys traveling with her grandchildren for baseball games and tournaments. She has 5 grandsons. Enjoys traveling.   Past Medical History:  Diagnosis Date   Asthma    Coronary atherosclerosis    Nonobstructive by coronary CTA April 2021   Essential hypertension    Hyperlipemia    Major depressive disorder    Recurrent upper respiratory infection (URI)    Rheumatoid arteritis ([place]) 12/24/2016   Type 2 diabetes mellitus ([place])     Past Surgical History:  Procedure Laterality Date   CESAREAN SECTION  1981 and 1985   CHOLECYSTECTOMY N/A 05/18/2018   Procedure: LAPAROSCOPIC CHOLECYSTECTOMY;  Surgeon: Virl Cagey, MD;  Location: [facility]S;  Service: General;  Laterality: N/A;   COLONOSCOPY N/A 01/01/2016   Procedure: COLONOSCOPY;   Surgeon: Rogene Houston, MD;  Location: [facility] ENDO SUITE;  Service: Endoscopy;  Laterality: N/A;  930   FOOT SURGERY Left    with screws   TONSILLECTOMY      Current Medications: Current Meds  Medication Sig   Abatacept (ORENCIA IV) Inject into the vein every 30 (thirty) days. Infusion - do in Rheumatology office   albuterol (VENTOLIN HFA) 108 (90 Base) MCG/ACT inhaler inhale 1-2 puffs EVERY 4 HOURS AS NEEDED FOR wheezing OR SHORTNESS OF BREATH   cholecalciferol (VITAMIN D3) 25 MCG (1000 UNIT) tablet Take 1,000 Units by mouth daily.   Cinnamon 500 MG capsule Take 1,000 mg by mouth daily.   EPINEPHrine (EPIPEN JR) 0.15 MG/0.3ML injection Inject 0.15 mg into the muscle as needed for anaphylaxis.   folic acid (FOLVITE) 1 MG tablet Take 1 mg by mouth daily.   hydrochlorothiazide (HYDRODIURIL) 25 MG tablet Take 25 mg by mouth daily.   losartan (COZAAR) 100 MG tablet Take 100 mg by mouth daily.   metFORMIN (GLUCOPHAGE) 500 MG tablet Take 1 tablet by mouth daily.   Multiple Vitamin (MULTIVITAMIN) tablet Take 1 tablet by mouth daily.   Multiple Vitamins-Minerals (ICAPS AREDS 2 PO) Take 1 capsule by mouth 2 (two) times daily.    omeprazole (PRILOSEC) 20 MG capsule Take 20 mg by mouth daily.   ondansetron (ZOFRAN) 4 MG tablet Take 4 mg by mouth 3 (three) times daily as needed.   OZEMPIC, 0.25 OR 0.5 MG/DOSE, 2 MG/3ML SOPN Inject into the skin.   rosuvastatin (  CRESTOR) 20 MG tablet Take 1 tablet by mouth once daily   topiramate (TOPAMAX) 25 MG tablet Take 25 mg by mouth daily.   traMADol (ULTRAM) 50 MG tablet Take 1 tablet (50 mg total) by mouth at bedtime.   traZODone (DESYREL) 50 MG tablet Take 50 mg by mouth at bedtime.   venlafaxine XR (EFFEXOR-XR) 75 MG 24 hr capsule Take 75 mg by mouth daily.   Allergies:   Ketorolac, Diltiazem, Ketorolac tromethamine, and Codeine   Social History   Socioeconomic History   Marital status: Married    Spouse name: Not on file   Number of children: Not on  file   Years of education: Not on file   Highest education level: Not on file  Occupational History   Not on file  Tobacco Use   Smoking status: Never    Passive exposure: Current   Smokeless tobacco: Never  Vaping Use   Vaping Use: Never used  Substance and Sexual Activity   Alcohol use: Not Currently    Comment: rarely   Drug use: No   Sexual activity: Not on file  Other Topics Concern   Not on file  Social History Narrative   Not on file   Social Determinants of Health   Financial Resource Strain: Not on file  Food Insecurity: Not on file  Transportation Needs: Not on file  Physical Activity: Not on file  Stress: Not on file  Social Connections: Not on file     Family History: The patient's family history includes Allergic rhinitis in her brother, father, mother, sister, and sister; Asthma in her mother; Cancer in her sister; Diabetes in her brother, mother, and sister; Heart attack in her father and sister; Stroke in her mother.  ROS:   Review of Systems  Constitutional: Negative.   HENT: Negative.    Eyes: Negative.   Respiratory: Negative.    Cardiovascular: Negative.   Gastrointestinal: Negative.   Genitourinary: Negative.   Musculoskeletal: Negative.   Skin: Negative.   Neurological: Negative.   Endo/Heme/Allergies: Negative.   Psychiatric/Behavioral: Negative.      Please see the history of present illness.    All other systems reviewed and are negative.  EKGs/Labs/Other Studies Reviewed:    The following studies were reviewed today:   EKG:  EKG is ordered today.  The ekg ordered today demonstrates sinus rhythm, 92 bpm, no acute ischemic changes.  Coronary CTA on October 19, 2019: 1.  Coronary calcium score 365.  This was 95th percentile for age and sex matched controls. 2.  Normal coronary origin with right dominance. 3.  Moderate stenosis (50-69%) in proximal LAD. 4.  Moderate stenosis (50-69%) in the first diagonal. 5.  Minimal (<25%) CAD in  the left circumflex/right coronary artery. 6.  Small hiatal hernia.  Otherwise no significant extracardiac findings.  Recommendations: 1.  Moderate stenosis.  Consider symptom-guided anti-ischemic pharmacotherapy as well as risk factor modification per guideline directed care. 2.  CT FFR of mid LAD and D1 are negative.  Lexiscan on January 03, 2018: Blood pressure demonstrated a hypertensive response to exercise. The study is normal. This is a low risk study. The left ventricular ejection fraction is hyperdynamic (>65%). There was no ST segment deviation noted during stress.   Normal resting and stress perfusion. No ischemia or infarction EF 68%  Recent Labs: No results found for requested labs within last 365 days.  Recent Lipid Panel No results found for: "CHOL", "TRIG", "HDL", "CHOLHDL", "VLDL", "LDLCALC", "LDLDIRECT"  Physical Exam:    VS:  BP 116/80   Pulse 93   Ht 5\' 5"  (1.651 m)   Wt 193 lb 9.6 oz (87.8 kg)   SpO2 96%   BMI 32.22 kg/m     Wt Readings from Last 3 Encounters:  08/13/22 193 lb 9.6 oz (87.8 kg)  06/26/21 211 lb 12.8 oz (96.1 kg)  04/20/21 215 lb (97.5 kg)     GEN: Obese, 67 y.o. female in no acute distress HEENT: Normal NECK: No JVD; No carotid bruits CARDIAC: S1/S2, RRR, no murmurs, rubs, gallops; 2+ pulses RESPIRATORY:  Clear to auscultation without rales, wheezing or rhonchi  MUSCULOSKELETAL:  No edema; No deformity  SKIN: Warm and dry NEUROLOGIC:  Alert and oriented x 3 PSYCHIATRIC:  Normal affect   ASSESSMENT:    1. Atherosclerosis of native coronary artery of native heart without angina pectoris   2. Essential hypertension   3. Hyperlipidemia, unspecified hyperlipidemia type   4. Obesity (BMI 30-39.9)    PLAN:    In order of problems listed above:  Coronary atherosclerosis Stable with no anginal symptoms. No indication for ischemic evaluation.  Continue current medication regimen. Heart healthy diet and regular cardiovascular exercise  encouraged.   HTN Blood pressure stable today.  BP well-controlled at home. Discussed to monitor BP at home at least 2 hours after medications and sitting for 5-10 minutes.  Continue hydrochlorothiazide and losartan. Heart healthy diet and regular cardiovascular exercise encouraged.   HLD Labs in 2023 were within normal limits.  Continue rosuvastatin. Heart healthy diet and regular cardiovascular exercise encouraged.   Obesity BMI 32.22 today. Weight loss via diet and exercise encouraged. Discussed the impact being overweight would have on cardiovascular risk. Heart healthy diet and regular cardiovascular exercise encouraged.   5.  Disposition: Follow-up in 1 year with Dr. 2024 or sooner if anything changes.  Medication Adjustments/Labs and Tests Ordered: Current medicines are reviewed at length with the patient today.  Concerns regarding medicines are outlined above.  Orders Placed This Encounter  Procedures   EKG 12-Lead   No orders of the defined types were placed in this encounter.   Patient Instructions  Medication Instructions:  Continue all current medications.      Labwork: none  Testing/Procedures: none  Follow-Up: Your physician wants you to follow up in:  1 year.  You should receive a call from the office when due.  If you don't receive this call, please call our office to schedule the follow up appointment    Any Other Special Instructions Will Be Listed Below (If Applicable).   If you need a refill on your cardiac medications before your next appointment, please call your pharmacy.    Signed, Diona Browner, NP  08/15/2022 6:51 PM    [facility]

## 2022-08-18 ENCOUNTER — Ambulatory Visit (INDEPENDENT_AMBULATORY_CARE_PROVIDER_SITE_OTHER): Payer: Medicare Other

## 2022-08-18 DIAGNOSIS — J4540 Moderate persistent asthma, uncomplicated: Secondary | ICD-10-CM

## 2022-09-18 ENCOUNTER — Other Ambulatory Visit: Payer: Self-pay | Admitting: Cardiology

## 2022-10-11 ENCOUNTER — Ambulatory Visit (INDEPENDENT_AMBULATORY_CARE_PROVIDER_SITE_OTHER): Payer: Medicare Other

## 2022-10-11 DIAGNOSIS — J4540 Moderate persistent asthma, uncomplicated: Secondary | ICD-10-CM

## 2022-10-11 DIAGNOSIS — J4550 Severe persistent asthma, uncomplicated: Secondary | ICD-10-CM

## 2022-11-17 ENCOUNTER — Ambulatory Visit (INDEPENDENT_AMBULATORY_CARE_PROVIDER_SITE_OTHER): Payer: Medicare Other | Admitting: Allergy & Immunology

## 2022-11-17 ENCOUNTER — Other Ambulatory Visit: Payer: Self-pay

## 2022-11-17 ENCOUNTER — Encounter: Payer: Self-pay | Admitting: Allergy & Immunology

## 2022-11-17 VITALS — BP 124/80 | HR 93 | Temp 97.9°F | Resp 18 | Ht 64.0 in | Wt 189.2 lb

## 2022-11-17 DIAGNOSIS — J302 Other seasonal allergic rhinitis: Secondary | ICD-10-CM | POA: Diagnosis not present

## 2022-11-17 DIAGNOSIS — J3089 Other allergic rhinitis: Secondary | ICD-10-CM | POA: Diagnosis not present

## 2022-11-17 DIAGNOSIS — J4540 Moderate persistent asthma, uncomplicated: Secondary | ICD-10-CM | POA: Diagnosis not present

## 2022-11-17 NOTE — Progress Notes (Signed)
FOLLOW UP  Date of Service/Encounter:  11/17/22   Assessment:   Moderate persistent asthma, uncomplicated - with an eosinophilic phenotype (AEC 800 October 2022) and doing VERY well on Fasenra   Seasonal and perennial allergic rhinitis (grasses, ragweed, weeds, trees, indoor molds, dust mites, cat, and dog)   Retired Runner, broadcasting/film/video   Plan/Recommendations:   1. Moderate persistent asthma, uncomplicated - Lung testing looked amazing today!  - I will check with Tammy to check to see if this is going to put in the gap.  - Daily controller medication(s): Izetta Dakin - Prior to physical activity: albuterol 2 puffs 10-15 minutes before physical activity. - Rescue medications: albuterol 4 puffs every 4-6 hours as needed - Asthma control goals:  * Full participation in all desired activities (may need albuterol before activity) * Albuterol use two time or less a week on average (not counting use with activity) * Cough interfering with sleep two time or less a month * Oral steroids no more than once a year * No hospitalizations  2. Seasonal and perennial allergic rhinitis (grasses, ragweed, weeds, trees, indoor molds, dust mites, cat, and dog) - Continue taking: Zyrtec (cetirizine) 10mg  tablet 1-2 times daily - You can use an extra dose of the antihistamine, if needed, for breakthrough symptoms.  - We can hold off on shots for now with long term control.  - Watch your prednisone use, we might have to consider allergy shots to avoid needing the steroids!   3. Return in about 6 months (around 05/20/2023).    Subjective:   Shawna Williams is a 67 y.o. female presenting today for follow up of  Chief Complaint  Patient presents with   Follow-up    Drainage    Shawna Williams has a history of the following: Patient Active Problem List   Diagnosis Date Noted   Asthmatic bronchitis , chronic 09/26/2020   Iliotibial band syndrome affecting left lower leg 09/16/2020   Carpal tunnel syndrome,  right upper limb 07/16/2020   Carpal tunnel syndrome, left upper limb 07/16/2020   Bilateral carpal tunnel syndrome 05/20/2020   Cholecystitis, acute    Calculus of gallbladder 05/16/2018   Rheumatoid arteritis (HCC) 12/24/2016   Primary osteoarthritis of both hands 06/28/2016   Primary osteoarthritis of both feet 06/28/2016   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  Shawna Williams is a 67 y.o. female presenting for a follow up visit.  She was last seen in May 2023.  That time, lung testing looked amazing.  We decreased her Symbicort to 2 puffs once daily.  We also continue with Fasenra every 8 weeks.  For her rhinitis, continue with Zyrtec 1-2 times a day.  Since last visit, she has done very well.  Asthma/Respiratory Symptom History: Breathing has been under good control with Symbicort as needed.  She remains on the Stella.  She has not been using her rescue inhaler. Her symptoms have been under excellent control. Shawna Williams's asthma has been well controlled. She has not required rescue medication, experienced nocturnal awakenings due to lower respiratory symptoms, nor have activities of daily living been limited. She has required no Emergency Department or Urgent Care visits for her asthma. She has required zero courses of systemic steroids for asthma exacerbations since the last  visit. ACT score today is 21, indicating excellent asthma symptom control.   Allergic Rhinitis Symptom History: She does have some drainage right now. Typically it is not too terrible. She did have a sore throat and went to her PCP and she was told that it was drainage. She was given nasal saline which did help. She did a steroid shot as well which did provide some relief.  She has not  been on antibiotics at all for any sinus or ear infections.   Otherwise, there have been no changes to her past medical history, surgical history, family history, or social history.    Review of Systems  Constitutional: Negative.  Negative for chills, fever, malaise/fatigue and weight loss.  HENT: Negative.  Negative for congestion, ear discharge and ear pain.   Eyes:  Negative for pain, discharge and redness.  Respiratory:  Negative for cough, sputum production, shortness of breath and wheezing.   Cardiovascular: Negative.  Negative for chest pain and palpitations.  Gastrointestinal:  Negative for abdominal pain, constipation, diarrhea, heartburn, nausea and vomiting.  Skin: Negative.  Negative for itching and rash.  Neurological:  Negative for dizziness and headaches.  Endo/Heme/Allergies:  Positive for environmental allergies. Does not bruise/bleed easily.       Objective:   Blood pressure 124/80, pulse 93, temperature 97.9 F (36.6 C), resp. rate 18, height 5\' 4"  (1.626 m), weight 189 lb 4 oz (85.8 kg), SpO2 100 %. Body mass index is 32.48 kg/m.    Physical Exam Vitals reviewed.  Constitutional:      Appearance: She is well-developed.     Comments: Delightful and talkative. Very pleasant.   HENT:     Head: Normocephalic and atraumatic.     Right Ear: Tympanic membrane, ear canal and external ear normal. No drainage, swelling or tenderness. Tympanic membrane is not injected, scarred, erythematous, retracted or bulging.     Left Ear: Tympanic membrane, ear canal and external ear normal. No drainage, swelling or tenderness. Tympanic membrane is not injected, scarred, erythematous, retracted or bulging.     Nose: No nasal deformity, septal deviation, mucosal edema or rhinorrhea.     Right Turbinates: Enlarged, swollen and pale.     Left Turbinates: Enlarged, swollen and pale.     Right Sinus: No maxillary sinus tenderness or frontal sinus tenderness.     Left Sinus: No  maxillary sinus tenderness or frontal sinus tenderness.     Comments: Minimal rhinorrhea.  Turbinates are enlarged without polyps.    Mouth/Throat:     Lips: Pink.     Mouth: Mucous membranes are moist. Mucous membranes are not pale and not dry.     Pharynx: Uvula midline.     Comments: Minimal cobblestoning.  Eyes:     General:        Right eye: No discharge.        Left eye: No discharge.     Conjunctiva/sclera: Conjunctivae normal.     Right eye: Right conjunctiva is not injected. No chemosis.    Left eye: Left conjunctiva is not injected. No chemosis.    Pupils: Pupils are equal, round, and reactive to light.  Cardiovascular:     Rate and Rhythm: Normal rate and regular rhythm.     Heart sounds: Normal heart sounds.  Pulmonary:     Effort: Pulmonary effort is normal. No tachypnea, accessory muscle usage or respiratory distress.     Breath sounds: Normal breath sounds. No decreased breath sounds, wheezing, rhonchi or rales.  Comments: No cough during the exam.  Moving air well in all lung fields. Chest:     Chest wall: No tenderness.  Abdominal:     Tenderness: There is no abdominal tenderness. There is no guarding or rebound.  Lymphadenopathy:     Head:     Right side of head: No submandibular, tonsillar or occipital adenopathy.     Left side of head: No submandibular, tonsillar or occipital adenopathy.     Cervical: No cervical adenopathy.  Skin:    Coloration: Skin is not pale.     Findings: No abrasion, erythema, petechiae or rash. Rash is not papular, urticarial or vesicular.  Neurological:     Mental Status: She is alert.  Psychiatric:        Behavior: Behavior is cooperative.      Diagnostic studies:    Spirometry: results normal (FEV1: 2.48/107%, FVC: 3.30/111%, FEV1/FVC: 75%).    Spirometry consistent with normal pattern.   Allergy Studies: none       Malachi Bonds, MD  [facility]

## 2022-11-17 NOTE — Patient Instructions (Addendum)
1. Moderate persistent asthma, uncomplicated - Lung testing looked amazing today!  - I will check with Tammy to check to see if this is going to put in the gap.  - Daily controller medication(s): Izetta Dakin - Prior to physical activity: albuterol 2 puffs 10-15 minutes before physical activity. - Rescue medications: albuterol 4 puffs every 4-6 hours as needed - Asthma control goals:  * Full participation in all desired activities (may need albuterol before activity) * Albuterol use two time or less a week on average (not counting use with activity) * Cough interfering with sleep two time or less a month * Oral steroids no more than once a year * No hospitalizations  2. Seasonal and perennial allergic rhinitis (grasses, ragweed, weeds, trees, indoor molds, dust mites, cat, and dog) - Continue taking: Zyrtec (cetirizine) 10mg  tablet 1-2 times daily - You can use an extra dose of the antihistamine, if needed, for breakthrough symptoms.  - We can hold off on shots for now with long term control.  - Watch your prednisone use, we might have to consider allergy shots to avoid needing the steroids!   3. Return in about 6 months (around 05/20/2023).    Please inform us of any Emergency Department visits, hospitalizations, or changes in symptoms. Call us before going to the ED for breathing or allergy symptoms since we might be able to fit you in for a sick visit. Feel free to contact us anytime with any questions, problems, or concerns.  It was a pleasure to see you again today! Have fun on your cruise trip!!   Websites that have reliable patient information: 1. American Academy of Asthma, Allergy, and Immunology: www.aaaai.org 2. Food Allergy Research and Education (FARE): foodallergy.org 3. Mothers of Asthmatics: http://www.asthmacommunitynetwork.org 4. American College of Allergy, Asthma, and Immunology: www.acaai.org   COVID-19 Vaccine Information can be found at:  [email or web address] For questions related to vaccine distribution or appointments, please email [email or web address] or call [phone].   We realize that you might be concerned about having an allergic reaction to the COVID19 vaccines. To help with that concern, WE ARE OFFERING THE COVID19 VACCINES IN OUR OFFICE! Ask the front desk for dates!     "Like" [place] on Facebook and Instagram for our latest updates!      A healthy democracy works best when [employer] participate! Make sure you are registered to vote! If you have moved or changed any of your contact information, you will need to get this updated before voting!  In some cases, you MAY be able to register to vote online: [email or web address]

## 2022-11-18 ENCOUNTER — Ambulatory Visit: Payer: Medicare Other | Admitting: Cardiology

## 2022-12-06 ENCOUNTER — Ambulatory Visit: Payer: Medicare Other

## 2022-12-10 ENCOUNTER — Ambulatory Visit (INDEPENDENT_AMBULATORY_CARE_PROVIDER_SITE_OTHER): Payer: Medicare Other

## 2022-12-10 DIAGNOSIS — J4550 Severe persistent asthma, uncomplicated: Secondary | ICD-10-CM

## 2022-12-31 ENCOUNTER — Other Ambulatory Visit (HOSPITAL_COMMUNITY): Payer: Self-pay | Admitting: Nurse Practitioner

## 2022-12-31 DIAGNOSIS — Z1231 Encounter for screening mammogram for malignant neoplasm of breast: Secondary | ICD-10-CM

## 2023-01-03 ENCOUNTER — Ambulatory Visit (HOSPITAL_COMMUNITY)
Admission: RE | Admit: 2023-01-03 | Discharge: 2023-01-03 | Disposition: A | Discharge status: Home / Self Care | Payer: Medicare Other | Source: Ambulatory Visit | Attending: Nurse Practitioner | Admitting: Nurse Practitioner

## 2023-01-03 DIAGNOSIS — Z1231 Encounter for screening mammogram for malignant neoplasm of breast: Secondary | ICD-10-CM | POA: Insufficient documentation

## 2023-02-07 ENCOUNTER — Ambulatory Visit (INDEPENDENT_AMBULATORY_CARE_PROVIDER_SITE_OTHER): Payer: Medicare Other

## 2023-02-07 DIAGNOSIS — J4550 Severe persistent asthma, uncomplicated: Secondary | ICD-10-CM

## 2023-03-18 ENCOUNTER — Other Ambulatory Visit: Payer: Self-pay

## 2023-03-18 MED ORDER — ROSUVASTATIN CALCIUM 20 MG PO TABS: 20.0000 mg | ORAL_TABLET | Freq: Every day | ORAL | 6 refills | Status: DC

## 2023-04-04 ENCOUNTER — Ambulatory Visit (INDEPENDENT_AMBULATORY_CARE_PROVIDER_SITE_OTHER): Payer: Medicare Other

## 2023-04-04 DIAGNOSIS — J4540 Moderate persistent asthma, uncomplicated: Secondary | ICD-10-CM

## 2023-05-27 ENCOUNTER — Other Ambulatory Visit: Payer: Self-pay

## 2023-05-27 ENCOUNTER — Ambulatory Visit: Payer: Medicare Other

## 2023-05-27 ENCOUNTER — Encounter: Payer: Self-pay | Admitting: Allergy & Immunology

## 2023-05-27 ENCOUNTER — Ambulatory Visit (INDEPENDENT_AMBULATORY_CARE_PROVIDER_SITE_OTHER): Payer: Medicare Other | Admitting: Allergy & Immunology

## 2023-05-27 VITALS — BP 130/90 | HR 102 | Temp 97.6°F | Resp 16 | Ht 64.0 in | Wt 193.6 lb

## 2023-05-27 DIAGNOSIS — J4550 Severe persistent asthma, uncomplicated: Secondary | ICD-10-CM

## 2023-05-27 DIAGNOSIS — J302 Other seasonal allergic rhinitis: Secondary | ICD-10-CM

## 2023-05-27 DIAGNOSIS — J3089 Other allergic rhinitis: Secondary | ICD-10-CM

## 2023-05-27 NOTE — Patient Instructions (Addendum)
1. Moderate persistent asthma, uncomplicated - Lung testing looked amazing today!  - We are not going to make any medication changes today.  - Daily controller medication(s): Fasenra every 8 weeks  - Prior to physical activity: albuterol 2 puffs 10-15 minutes before physical activity. - Rescue medications: albuterol 4 puffs every 4-6 hours as needed - Changes during respiratory infections or worsening symptoms: Add on Symbicort 160/4.5 mcg two puffs twice daily for ONE TO TWO WEEKS. - Asthma control goals:  * Full participation in all desired activities (may need albuterol before activity) * Albuterol use two time or less a week on average (not counting use with activity) * Cough interfering with sleep two time or less a month * Oral steroids no more than once a year * No hospitalizations  2. Seasonal and perennial allergic rhinitis (grasses, ragweed, weeds, trees, indoor molds, dust mites, cat, and dog) - Continue taking: Zyrtec (cetirizine) 10mg  tablet 1-2 times daily - You can use an extra dose of the antihistamine, if needed, for breakthrough symptoms.  - We can hold off on shots for now with long term control.   3. Return in about 6 months (around 11/24/2023).    Please inform us of any Emergency Department visits, hospitalizations, or changes in symptoms. Call us before going to the ED for breathing or allergy symptoms since we might be able to fit you in for a sick visit. Feel free to contact us anytime with any questions, problems, or concerns.  It was a pleasure to see you again today!   Websites that have reliable patient information: 1. American Academy of Asthma, Allergy, and Immunology: www.aaaai.org 2. Food Allergy Research and Education (FARE): foodallergy.org 3. Mothers of Asthmatics: http://www.asthmacommunitynetwork.org 4. American College of Allergy, Asthma, and Immunology: www.acaai.org   COVID-19 Vaccine Information can be found at:  [email or web address] For questions related to vaccine distribution or appointments, please email [email or web address] or call [phone].   We realize that you might be concerned about having an allergic reaction to the COVID19 vaccines. To help with that concern, WE ARE OFFERING THE COVID19 VACCINES IN OUR OFFICE! Ask the front desk for dates!     "Like" [place] on Facebook and Instagram for our latest updates!      A healthy democracy works best when [employer] participate! Make sure you are registered to vote! If you have moved or changed any of your contact information, you will need to get this updated before voting!  In some cases, you MAY be able to register to vote online: [email or web address]

## 2023-05-27 NOTE — Progress Notes (Unsigned)
FOLLOW UP  Date of Service/Encounter:  05/27/23   Assessment:   Moderate persistent asthma, uncomplicated - with an eosinophilic phenotype (AEC 800 October 2022) and doing VERY well on Fasenra   Seasonal and perennial allergic rhinitis (grasses, ragweed, weeds, trees, indoor molds, dust mites, cat, and dog)   Retired Runner, broadcasting/film/video   Plan/Recommendations:   Assessment and Plan              Patient Instructions  1. Moderate persistent asthma, uncomplicated - Lung testing looked amazing today!  - We are not going to make any medication changes today.  - Daily controller medication(s): Fasenra every 8 weeks  - Prior to physical activity: albuterol 2 puffs 10-15 minutes before physical activity. - Rescue medications: albuterol 4 puffs every 4-6 hours as needed - Changes during respiratory infections or worsening symptoms: Add on Symbicort 160/4.5 mcg two puffs twice daily for ONE TO TWO WEEKS. - Asthma control goals:  * Full participation in all desired activities (may need albuterol before activity) * Albuterol use two time or less a week on average (not counting use with activity) * Cough interfering with sleep two time or less a month * Oral steroids no more than once a year * No hospitalizations  2. Seasonal and perennial allergic rhinitis (grasses, ragweed, weeds, trees, indoor molds, dust mites, cat, and dog) - Continue taking: Zyrtec (cetirizine) 10mg  tablet 1-2 times daily - You can use an extra dose of the antihistamine, if needed, for breakthrough symptoms.  - We can hold off on shots for now with long term control.   3. Return in about 6 months (around 11/24/2023).    Please inform us of any Emergency Department visits, hospitalizations, or changes in symptoms. Call us before going to the ED for breathing or allergy symptoms since we might be able to fit you in for a sick visit. Feel free to contact us anytime with any questions, problems, or concerns.  It was a  pleasure to see you again today!   Websites that have reliable patient information: 1. American Academy of Asthma, Allergy, and Immunology: www.aaaai.org 2. Food Allergy Research and Education (FARE): foodallergy.org 3. Mothers of Asthmatics: http://www.asthmacommunitynetwork.org 4. American College of Allergy, Asthma, and Immunology: www.acaai.org   COVID-19 Vaccine Information can be found at: [email or web address] For questions related to vaccine distribution or appointments, please email [email or web address] or call [phone].   We realize that you might be concerned about having an allergic reaction to the COVID19 vaccines. To help with that concern, WE ARE OFFERING THE COVID19 VACCINES IN OUR OFFICE! Ask the front desk for dates!     "Like" [place] on Facebook and Instagram for our latest updates!      A healthy democracy works best when [employer] participate! Make sure you are registered to vote! If you have moved or changed any of your contact information, you will need to get this updated before voting!  In some cases, you MAY be able to register to vote online: [email or web address]          Subjective:   Shawna Williams is a 68 y.o. female presenting today for follow up of  Chief Complaint  Patient presents with   Asthma    Shawna Williams has a history of the following: Patient Active Problem List   Diagnosis Date Noted   Asthmatic bronchitis , chronic (HCC) 09/26/2020   Iliotibial band syndrome affecting left lower leg 09/16/2020   Carpal tunnel syndrome, right upper limb 07/16/2020  Carpal tunnel syndrome, left upper limb 07/16/2020   Bilateral carpal tunnel syndrome 05/20/2020   Cholecystitis, acute    Calculus of gallbladder 05/16/2018   Rheumatoid arteritis (HCC) 12/24/2016   Primary osteoarthritis of both hands 06/28/2016   Primary osteoarthritis of both feet  06/28/2016   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  Discussed the use of AI scribe software for clinical note transcription with the patient and/or guardian, who gave verbal consent to proceed.  Shawna Williams is a 67 y.o. female presenting for {Blank single:19197::"a food challenge","a drug challenge","skin testing","a sick visit","an evaluation of ***","a follow up visit"}.  She was last seen in May 2024.  At that time, we continued with Harrington Challenger every 8 weeks.  She also had albuterol to use 4 puffs every 4-6 hours as needed.  She has Symbicort to use as needed as well.  For her rhinitis, we continue with Zyrtec 10 mg 1-2 times daily.  Discussed the use of AI scribe software for clinical note transcription with the patient, who gave verbal consent to proceed.  History of Present Illness            {Blank single:19197::"Asthma/Respiratory Symptom History: ***"," "}  {Blank single:19197::"Allergic Rhinitis Symptom History: ***"," "}  {Blank single:19197::"Food Allergy Symptom History: ***"," "}  {Blank single:19197::"Skin Symptom History: ***"," "}  {Blank single:19197::"GERD Symptom History: ***"," "}  Otherwise, there have been no changes to her past medical history, surgical history, family history, or social history.    Review of systems otherwise negative other than that mentioned in the HPI.    Objective:   Blood pressure (!) 130/90, pulse (!) 102, temperature 97.6 F (36.4 C), resp. rate 16, height 5\' 4"  (1.626 m), weight 193 lb 9.6 oz (87.8 kg), SpO2 97%. Body mass index is 33.23 kg/m.    Physical Exam   Diagnostic studies:    Spirometry: results normal (FEV1: 2.61/114%, FVC:  3.20/109%, FEV1/FVC: 82%).    Spirometry consistent with normal pattern. {Blank single:19197::"Albuterol/Atrovent nebulizer","Xopenex/Atrovent nebulizer","Albuterol nebulizer","Albuterol four puffs via MDI","Xopenex four puffs via MDI"} treatment given in clinic with {Blank single:19197::"significant improvement in FEV1 per ATS criteria","significant improvement in FVC per ATS criteria","significant improvement in FEV1 and FVC per ATS criteria","improvement in FEV1, but not significant per ATS criteria","improvement in FVC, but not significant per ATS criteria","improvement in FEV1 and FVC, but not significant per ATS criteria","no improvement"}.  Allergy Studies: {Blank single:19197::"none","deferred due to recent antihistamine use","deferred due to insurance stipulations that require a separate visit for testing","labs sent instead"," "}    {Blank single:19197::"Allergy testing results were read and interpreted by myself, documented by clinical staff."," "}      Malachi Bonds, MD  [facility]

## 2023-05-30 ENCOUNTER — Ambulatory Visit: Payer: Medicare Other

## 2023-05-31 ENCOUNTER — Encounter: Payer: Self-pay | Admitting: Allergy & Immunology

## 2023-05-31 MED ORDER — ALBUTEROL SULFATE HFA 108 (90 BASE) MCG/ACT IN AERS: 2.0000 | INHALATION_SPRAY | RESPIRATORY_TRACT | 1 refills | Status: DC | PRN

## 2023-07-25 ENCOUNTER — Ambulatory Visit: Payer: Medicare Other

## 2023-07-25 DIAGNOSIS — J4550 Severe persistent asthma, uncomplicated: Secondary | ICD-10-CM | POA: Diagnosis not present

## 2023-08-23 ENCOUNTER — Encounter: Payer: Self-pay | Admitting: Nurse Practitioner

## 2023-08-23 ENCOUNTER — Ambulatory Visit: Payer: Medicare Other | Attending: Nurse Practitioner | Admitting: Nurse Practitioner

## 2023-08-23 VITALS — BP 120/72 | HR 91 | Ht 65.0 in | Wt 188.8 lb

## 2023-08-23 DIAGNOSIS — E785 Hyperlipidemia, unspecified: Secondary | ICD-10-CM | POA: Insufficient documentation

## 2023-08-23 DIAGNOSIS — I10 Essential (primary) hypertension: Secondary | ICD-10-CM | POA: Insufficient documentation

## 2023-08-23 DIAGNOSIS — R748 Abnormal levels of other serum enzymes: Secondary | ICD-10-CM | POA: Diagnosis present

## 2023-08-23 DIAGNOSIS — E669 Obesity, unspecified: Secondary | ICD-10-CM | POA: Diagnosis present

## 2023-08-23 DIAGNOSIS — I2510 Atherosclerotic heart disease of native coronary artery without angina pectoris: Secondary | ICD-10-CM | POA: Diagnosis not present

## 2023-08-23 MED ORDER — LOSARTAN POTASSIUM 100 MG PO TABS: 100.0000 mg | ORAL_TABLET | Freq: Every day | ORAL | 3 refills | Status: DC

## 2023-08-23 MED ORDER — HYDROCHLOROTHIAZIDE 25 MG PO TABS: 25.0000 mg | ORAL_TABLET | Freq: Every day | ORAL | 3 refills | Status: DC

## 2023-08-23 MED ORDER — LOSARTAN POTASSIUM 100 MG PO TABS: 100.0000 mg | ORAL_TABLET | Freq: Every day | ORAL | 3 refills | Status: AC

## 2023-08-23 MED ORDER — HYDROCHLOROTHIAZIDE 25 MG PO TABS: 25.0000 mg | ORAL_TABLET | Freq: Every day | ORAL | 3 refills | Status: AC

## 2023-08-23 MED ORDER — ROSUVASTATIN CALCIUM 10 MG PO TABS: 10.0000 mg | ORAL_TABLET | Freq: Every day | ORAL | 3 refills | Status: DC

## 2023-08-23 NOTE — Patient Instructions (Addendum)
 Medication Instructions:  Your physician has recommended you make the following change in your medication:  Please reduce Rosuvastatin  to 10 mg daily   Labwork: In 6-8 weeks   Testing/Procedures: None   Follow-Up: Your physician recommends that you schedule a follow-up appointment in: 1 year   Any Other Special Instructions Will Be Listed Below (If Applicable).  If you need a refill on your cardiac medications before your next appointment, please call your pharmacy.

## 2023-08-23 NOTE — Progress Notes (Signed)
 Cardiology Office Note:    Date:  08/23/2023 ID:  CECLIA Williams, DOB 01-05-1956, MRN [id number] PCP:  Lori Thedora [facility] Providers Cardiologist:  Jayson Sierras, MD   Referring MD: Hairfield, Keavie C, NP   CC: Here for 1 year follow-up  History of Present Illness:    Shawna Williams is a delightful 68 y.o. female with a PMH of coronary atherosclerosis, hypertension, hyperlipidemia, type 2 diabetes, asthma, RA, and MDD, who presents today for 1 year follow-up.  History of coronary CTA in April 2021 that showed nonobstructive coronary atherosclerosis.  She is a patient of Dr. Darlean who is managing her asthma.  Last seen by Dr. Sierras on December 22, 2020.  She was overall feeling well, and denied any chest pain.  Blood pressure was well-controlled.  No medication changes were made.  Was told to follow-up in 1 year.  08/13/2022 - Today she presents for 1 year follow-up.  She states that she is doing well. Denies any chest pain, shortness of breath, palpitations, syncope, presyncope, dizziness, orthopnea, PND, swelling or significant weight changes, acute bleeding, or claudication.  Denies any other questions or concerns today.  08/23/2023 - Here for 1 year follow-up. Continues to do well and denies any changes to her health since I last saw her. Denies any acute cardiac complaints or concerns. Denies any chest pain, shortness of breath, palpitations, syncope, presyncope, dizziness, orthopnea, PND, swelling or significant weight changes, acute bleeding, or claudication. Tells me she has had recent elevations in her liver enzymes.   SH: Enjoys traveling with her grandchildren for baseball games and tournaments. She has 5 grandsons. Enjoys traveling.   Please see the history of present illness.    All other systems reviewed and are negative.  EKGs/Labs/Other Studies Reviewed:    The following studies were reviewed today:   EKG:   EKG Interpretation Date/Time:  Tuesday  August 23 2023 13:31:00 EST Ventricular Rate:  91 PR Interval:  148 QRS Duration:  80 QT Interval:  356 QTC Calculation: 437 R Axis:   -4  Text Interpretation: Normal sinus rhythm Cannot rule out Anterior infarct , age undetermined No previous ECGs available Confirmed by Miriam Norris [phone]) on 08/23/2023 1:46:37 PM   Coronary CTA on October 19, 2019: 1.  Coronary calcium  score 365.  This was 95th percentile for age and sex matched controls. 2.  Normal coronary origin with right dominance. 3.  Moderate stenosis (50-69%) in proximal LAD. 4.  Moderate stenosis (50-69%) in the first diagonal. 5.  Minimal (<25%) CAD in the left circumflex/right coronary artery. 6.  Small hiatal hernia.  Otherwise no significant extracardiac findings.  Recommendations: 1.  Moderate stenosis.  Consider symptom-guided anti-ischemic pharmacotherapy as well as risk factor modification per guideline directed care. 2.  CT FFR of mid LAD and D1 are negative.  Lexiscan  on January 03, 2018: Blood pressure demonstrated a hypertensive response to exercise. The study is normal. This is a low risk study. The left ventricular ejection fraction is hyperdynamic (>65%). There was no ST segment deviation noted during stress.   Normal resting and stress perfusion. No ischemia or infarction EF 68%   Physical Exam:    VS:  BP 120/72   Pulse 91   Ht 5' 5 (1.651 m)   Wt 188 lb 12.8 oz (85.6 kg)   SpO2 97%   BMI 31.42 kg/m     Wt Readings from Last 3 Encounters:  08/23/23 188 lb 12.8 oz (85.6 kg)  05/27/23 193 lb 9.6 oz (87.8 kg)  11/17/22 189 lb 4 oz (85.8 kg)     GEN: Obese, 68 y.o. female in no acute distress HEENT: Normal NECK: No JVD; No carotid bruits CARDIAC: S1/S2, RRR, no murmurs, rubs, gallops; 2+ pulses RESPIRATORY:  Clear to auscultation without rales, wheezing or rhonchi  MUSCULOSKELETAL:  No edema; No deformity  SKIN: Warm and dry NEUROLOGIC:  Alert and oriented x 3 PSYCHIATRIC:  Normal affect    ASSESSMENT & PLAN:    In order of problems listed above:  Coronary atherosclerosis Stable with no anginal symptoms. No indication for ischemic evaluation.  Will reduce rosuvastatin  to 10 mg daily and repeat FLP/LFT in 6-8 weeks. No other medication changes at this time. Heart healthy diet and regular cardiovascular exercise encouraged.   HTN Blood pressure stable today.  BP well-controlled at home. Discussed to monitor BP at home at least 2 hours after medications and sitting for 5-10 minutes.  Continue hydrochlorothiazide  and losartan . Heart healthy diet and regular cardiovascular exercise encouraged.   HLD LDL 35 01/2023.  She has had recent liver enzyme elevations seen with her recent lab work.  Will reduce rosuvastatin  to 10 mg daily and repeat FLP/LFT in 6 to 8 weeks for further evaluation.  Heart healthy diet and regular cardiovascular exercise encouraged. Continue to follow with PCP.  Obesity Weight loss via diet and exercise encouraged. Discussed the impact being overweight would have on cardiovascular risk. Heart healthy diet and regular cardiovascular exercise encouraged.   5. Elevated liver enzymes Recent AST/ALT enzyme elevations seen on recent labs 07/2023. Will reduce rosuvastatin  to 10 mg daily and repeat FLP/LFT in 6-8 weeks for further evaluation. Recommended to continue to follow with PCP.  5.  Disposition: Will provide refills per her request. Follow-up in 1 year with Dr. Debera or APP or sooner if anything changes.  Patient Instructions  Medication Instructions:  Your physician has recommended you make the following change in your medication:  Please reduce Rosuvastatin  to 10 mg daily   Labwork: In 6-8 weeks   Testing/Procedures: None   Follow-Up: Your physician recommends that you schedule a follow-up appointment in: 1 year   Any Other Special Instructions Will Be Listed Below (If Applicable).  If you need a refill on your cardiac medications before your  next appointment, please call your pharmacy.   Signed, Almarie Crate, NP

## 2023-09-19 ENCOUNTER — Ambulatory Visit: Payer: Medicare Other

## 2023-09-19 DIAGNOSIS — J4550 Severe persistent asthma, uncomplicated: Secondary | ICD-10-CM | POA: Diagnosis not present

## 2023-11-25 ENCOUNTER — Encounter: Payer: Self-pay | Admitting: Allergy & Immunology

## 2023-11-25 ENCOUNTER — Ambulatory Visit: Payer: Medicare Other | Admitting: Allergy & Immunology

## 2023-11-25 ENCOUNTER — Other Ambulatory Visit: Payer: Self-pay

## 2023-11-25 ENCOUNTER — Encounter

## 2023-11-25 VITALS — BP 120/80 | HR 103 | Temp 97.6°F | Resp 18 | Ht 63.39 in | Wt 192.4 lb

## 2023-11-25 DIAGNOSIS — J4550 Severe persistent asthma, uncomplicated: Secondary | ICD-10-CM

## 2023-11-25 DIAGNOSIS — J3089 Other allergic rhinitis: Secondary | ICD-10-CM

## 2023-11-25 DIAGNOSIS — J302 Other seasonal allergic rhinitis: Secondary | ICD-10-CM | POA: Diagnosis not present

## 2023-11-25 MED ORDER — CETIRIZINE HCL 10 MG PO TABS: 10.0000 mg | ORAL_TABLET | Freq: Every day | ORAL | 3 refills | Status: AC

## 2023-11-25 NOTE — Patient Instructions (Addendum)
 1. Moderate persistent asthma, uncomplicated - Lung testing looked amazing today!  - We are not going to make any medication changes today.  - Daily controller medication(s): Fasenra  every 8 weeks  - Prior to physical activity: albuterol  2 puffs 10-15 minutes before physical activity. - Rescue medications: albuterol  4 puffs every 4-6 hours as needed - Changes during respiratory infections or worsening symptoms: Add on Symbicort  160/4.5 mcg two puffs twice daily for ONE TO TWO WEEKS. - Asthma control goals:  * Full participation in all desired activities (may need albuterol  before activity) * Albuterol  use two time or less a week on average (not counting use with activity) * Cough interfering with sleep two time or less a month * Oral steroids no more than once a year * No hospitalizations  2. Seasonal and perennial allergic rhinitis (grasses, ragweed, weeds, trees, indoor molds, dust mites, cat, and dog) - Continue taking: Zyrtec  (cetirizine ) 10mg  tablet 1-2 times daily - You can use an extra dose of the antihistamine, if needed, for breakthrough symptoms.  - We can hold off on shots for now with long term control.   3. Return in about 1 year (around 11/24/2024). You can have the follow up appointment with Dr. Idolina Maker or a Nurse Practicioner (our Nurse Practitioners are excellent and always have Physician oversight!).    Please inform us  of any Emergency Department visits, hospitalizations, or changes in symptoms. Call us  before going to the ED for breathing or allergy symptoms since we might be able to fit you in for a sick visit. Feel free to contact us  anytime with any questions, problems, or concerns.  It was a pleasure to see you again today!  Websites that have reliable patient information: 1. American Academy of Asthma, Allergy, and Immunology: www.aaaai.org 2. Food Allergy Research and Education (FARE): foodallergy.org 3. Mothers of Asthmatics:  http://www.asthmacommunitynetwork.org 4. American College of Allergy, Asthma, and Immunology: www.acaai.org      "Like" us  on Facebook and Instagram for our latest updates!      A healthy democracy works best when [employer] participate! Make sure you are registered to vote! If you have moved or changed any of your contact information, you will need to get this updated before voting! Scan the QR codes below to learn more!

## 2023-11-25 NOTE — Progress Notes (Unsigned)
 FOLLOW UP  Date of Service/Encounter:  11/25/23   Assessment:   Moderate persistent asthma, uncomplicated - with an eosinophilic phenotype (AEC 800 October 2022) and doing VERY well on Fasenra    Seasonal and perennial allergic rhinitis (grasses, ragweed, weeds, trees, indoor molds, dust mites, cat, and dog)   Retired Runner, broadcasting/film/video   Plan/Recommendations:   Patient Instructions  1. Moderate persistent asthma, uncomplicated - Lung testing looked amazing today!  - We are not going to make any medication changes today.  - Daily controller medication(s): Fasenra  every 8 weeks  - Prior to physical activity: albuterol  2 puffs 10-15 minutes before physical activity. - Rescue medications: albuterol  4 puffs every 4-6 hours as needed - Changes during respiratory infections or worsening symptoms: Add on Symbicort  160/4.5 mcg two puffs twice daily for ONE TO TWO WEEKS. - Asthma control goals:  * Full participation in all desired activities (may need albuterol  before activity) * Albuterol  use two time or less a week on average (not counting use with activity) * Cough interfering with sleep two time or less a month * Oral steroids no more than once a year * No hospitalizations  2. Seasonal and perennial allergic rhinitis (grasses, ragweed, weeds, trees, indoor molds, dust mites, cat, and dog) - Continue taking: Zyrtec  (cetirizine ) 10mg  tablet 1-2 times daily - You can use an extra dose of the antihistamine, if needed, for breakthrough symptoms.  - We can hold off on shots for now with long term control.   3. Return in about 1 year (around 11/24/2024). You can have the follow up appointment with Dr. Idolina Maker or a Nurse Practicioner (our Nurse Practitioners are excellent and always have Physician oversight!).    Please inform us  of any Emergency Department visits, hospitalizations, or changes in symptoms. Call us  before going to the ED for breathing or allergy symptoms since we might be able to fit  you in for a sick visit. Feel free to contact us  anytime with any questions, problems, or concerns.  It was a pleasure to see you again today!  Websites that have reliable patient information: 1. American Academy of Asthma, Allergy, and Immunology: www.aaaai.org 2. Food Allergy Research and Education (FARE): foodallergy.org 3. Mothers of Asthmatics: http://www.asthmacommunitynetwork.org 4. American College of Allergy, Asthma, and Immunology: www.acaai.org      "Like" us  on Facebook and Instagram for our latest updates!      A healthy democracy works best when [employer] participate! Make sure you are registered to vote! If you have moved or changed any of your contact information, you will need to get this updated before voting! Scan the QR codes below to learn more!                Subjective:   Shawna Williams is a 68 y.o. female presenting today for follow up of  Chief Complaint  Patient presents with   Follow-up    Asthma doing well    Shawna Williams has a history of the following: Patient Active Problem List   Diagnosis Date Noted   Asthmatic bronchitis , chronic (HCC) 09/26/2020   Iliotibial band syndrome affecting left lower leg 09/16/2020   Carpal tunnel syndrome, right upper limb 07/16/2020   Carpal tunnel syndrome, left upper limb 07/16/2020   Bilateral carpal tunnel syndrome 05/20/2020   Cholecystitis, acute    Calculus of gallbladder 05/16/2018   Rheumatoid arteritis (HCC) 12/24/2016   Primary osteoarthritis of both hands 06/28/2016   Primary osteoarthritis of both feet 06/28/2016  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  Discussed the use of AI scribe  software for clinical note transcription with the patient and/or guardian, who gave verbal consent to proceed.  Shawna Williams is a 67 y.o. female presenting for {Blank single:19197::"a food challenge","a drug challenge","skin testing","a sick visit","an evaluation of ***","a follow up visit"}.  Asthma/Respiratory Symptom History: ***  Allergic Rhinitis Symptom History: ***  Food Allergy Symptom History: ***  Skin Symptom History: ***  GERD Symptom History: ***  Infection Symptom History: ***  Otherwise, there have been no changes to her past medical history, surgical history, family history, or social history.    Review of systems otherwise negative other than that mentioned in the HPI.    Objective:   Blood pressure 120/80, pulse (!) 103, temperature 97.6 F (36.4 C), temperature source Temporal, resp. rate 18, height 5' 3.39" (1.61 m), weight 192 lb 6.4 oz (87.3 kg), SpO2 97%. Body mass index is 33.67 kg/m.    Physical Exam   Diagnostic studies:    Spirometry: results normal (FEV1: 2.23/101%, FVC: 2.70/96%, FEV1/FVC: 83%).    {Blank single:19197::"Spirometry consistent with mild obstructive disease","Spirometry consistent with moderate obstructive disease","Spirometry consistent with severe obstructive disease","Spirometry consistent with possible restrictive disease","Spirometry consistent with mixed obstructive and restrictive disease","Spirometry uninterpretable due to technique","Spirometry consistent with normal pattern"}. {Blank single:19197::"Albuterol /Atrovent nebulizer","Xopenex/Atrovent nebulizer","Albuterol  nebulizer","Albuterol  four puffs via MDI","Xopenex four puffs via MDI"} treatment given in clinic with {Blank single:19197::"significant improvement in FEV1 per ATS criteria","significant improvement in FVC per ATS criteria","significant improvement in FEV1 and FVC per ATS criteria","improvement in FEV1, but not significant per ATS criteria","improvement in FVC, but not  significant per ATS criteria","improvement in FEV1 and FVC, but not significant per ATS criteria","no improvement"}.  Allergy Studies: {Blank single:19197::"none","deferred due to recent antihistamine use","deferred due to insurance stipulations that require a separate visit for testing","labs sent instead"," "}    {Blank single:19197::"Allergy testing results were read and interpreted by myself, documented by clinical staff."," "}      Drexel Gentles, MD  [facility] 

## 2023-11-28 ENCOUNTER — Encounter: Payer: Self-pay | Admitting: Allergy & Immunology

## 2024-01-03 IMAGING — MG MM DIGITAL SCREENING BILAT W/ TOMO AND CAD
6 of 10 series · 6 of 30 positions shown · non-contrast
Comparison: Previous exam(s).

CLINICAL DATA: Screening.

EXAM:
DIGITAL SCREENING BILATERAL MAMMOGRAM WITH TOMOSYNTHESIS AND CAD
TECHNIQUE: Bilateral screening digital craniocaudal and mediolateral oblique
mammograms were obtained. Bilateral screening digital breast
tomosynthesis was performed. The images were evaluated with
computer-aided detection.

[L CC synth-2D]
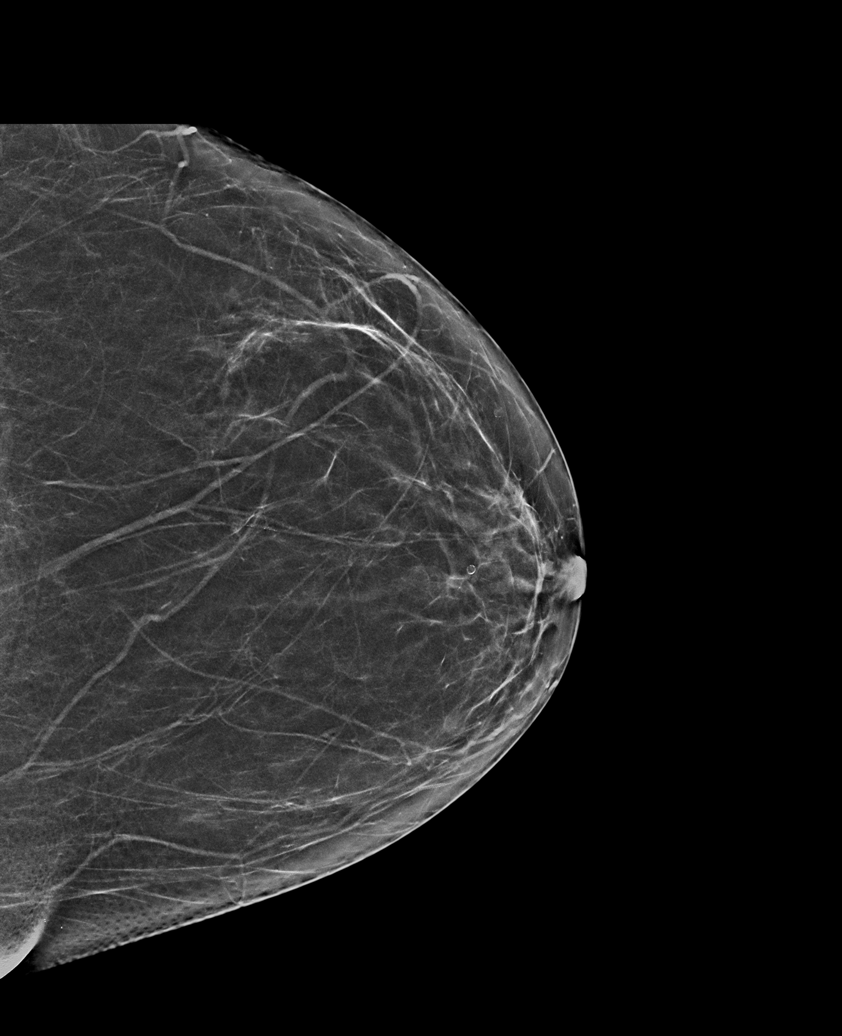

[R CC synth-2D (1 of 2)]
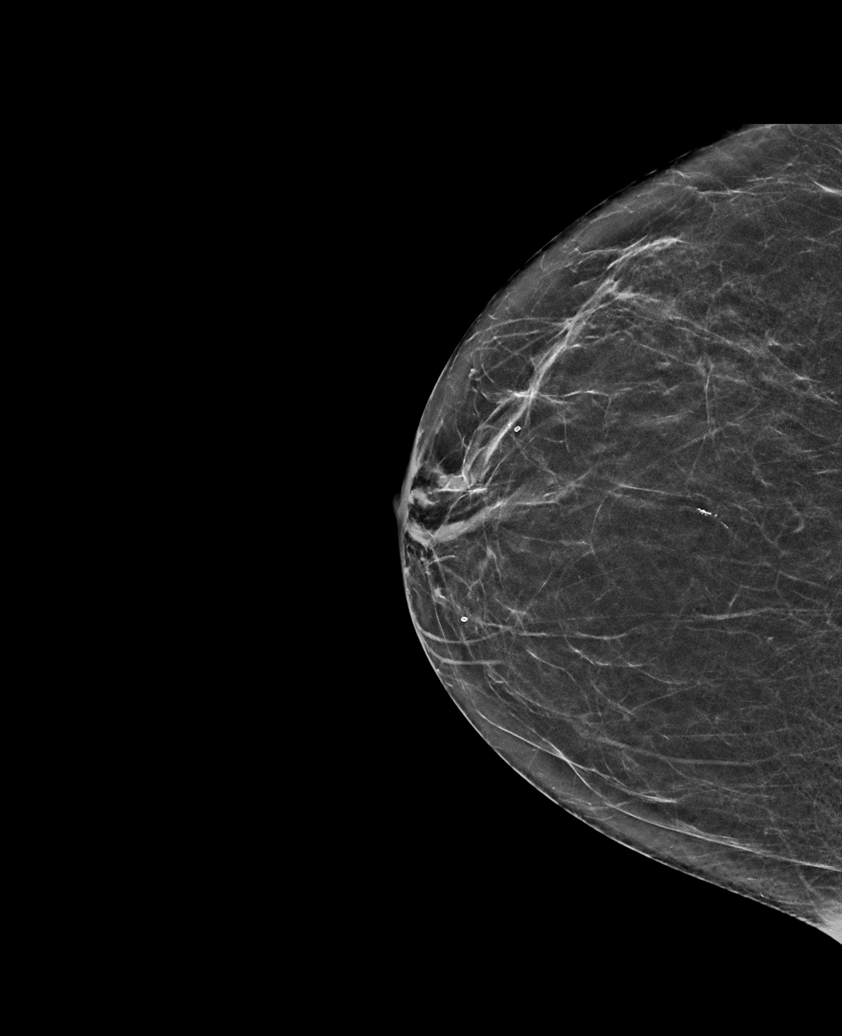

[L MLO synth-2D]
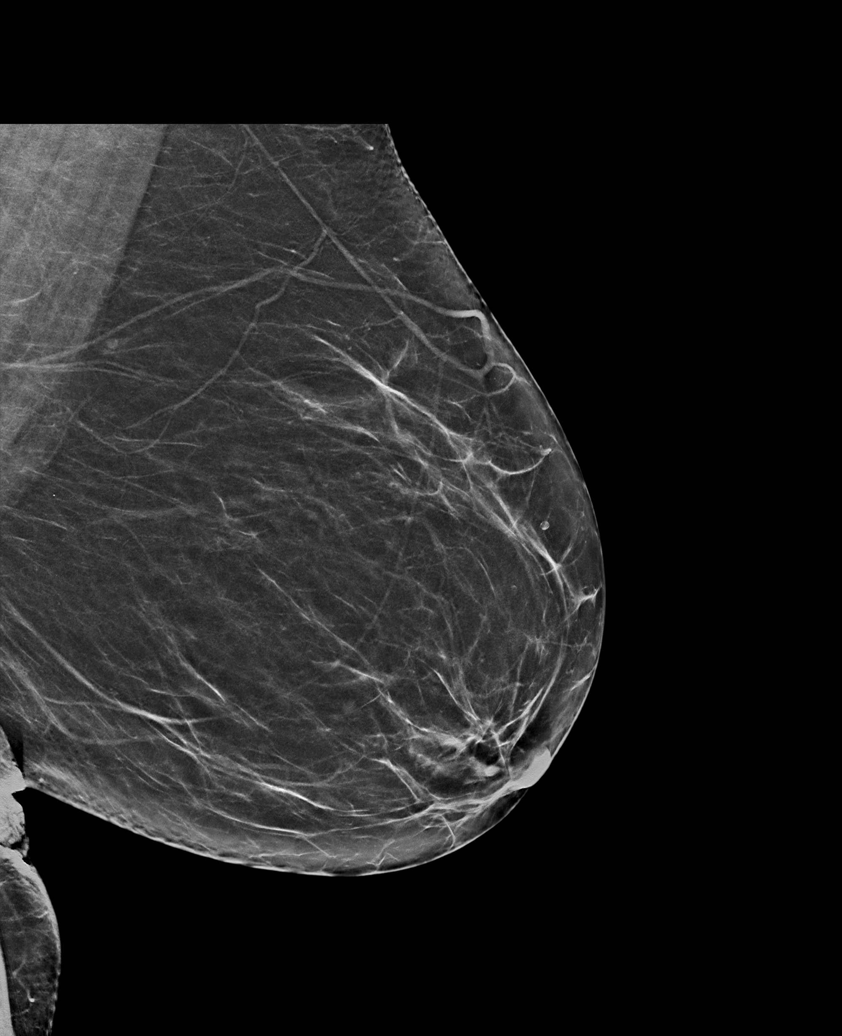

[R MLO synth-2D]
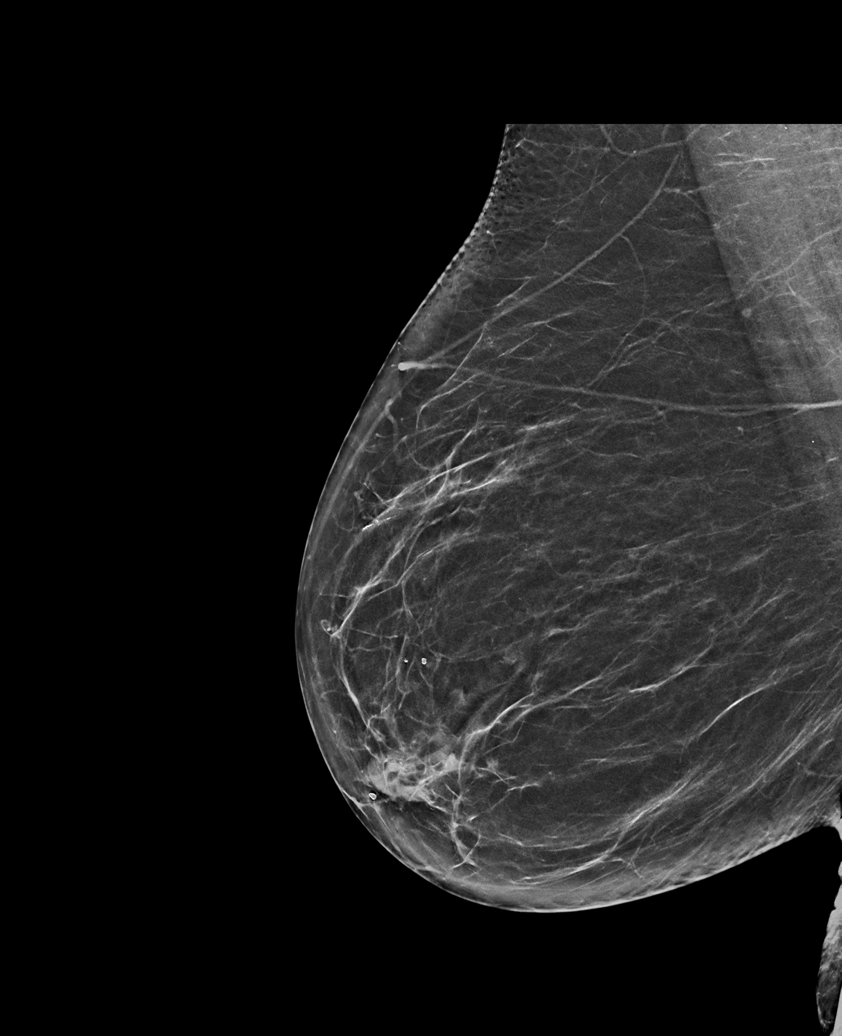

[R CC synth-2D (2 of 2)]
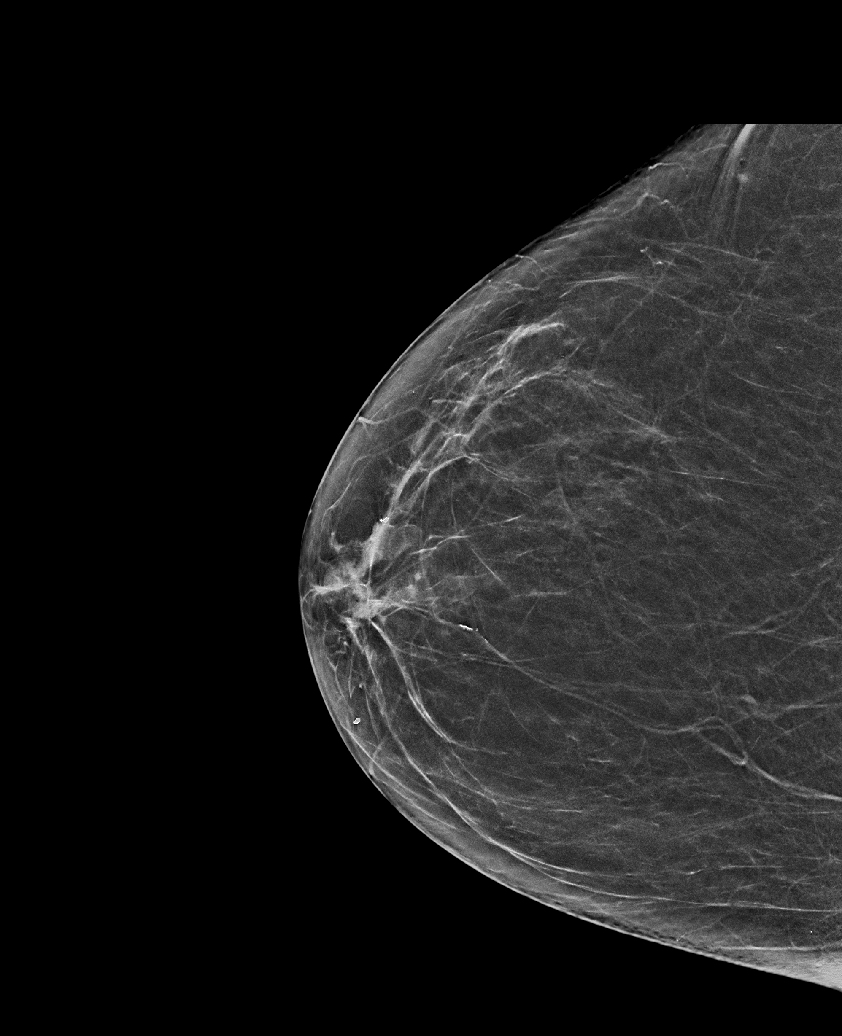

[L CC tomo · tomo slice 33/65.0]
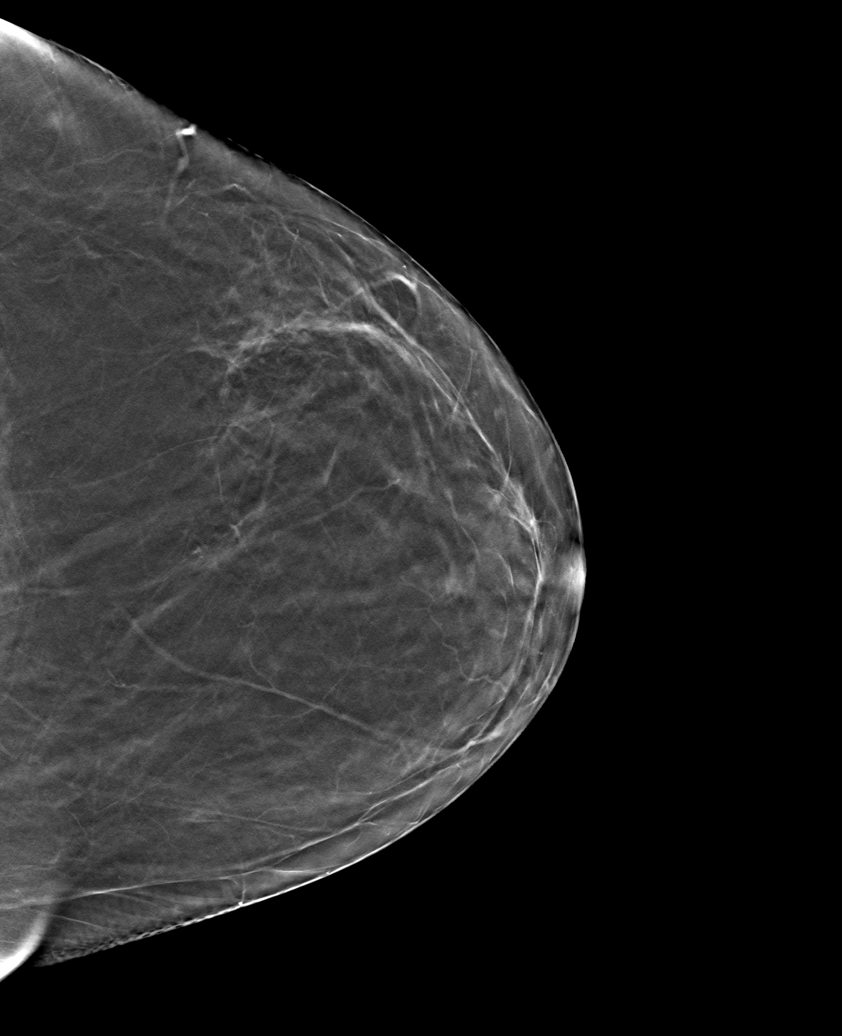

[6 of 30 positions shown; findings below may reference images not displayed]

ACR Breast Density Category b: There are scattered areas of
fibroglandular density.
FINDINGS: There are no findings suspicious for malignancy.
IMPRESSION: No mammographic evidence of malignancy. A result letter of this
screening mammogram will be mailed directly to the patient.

RECOMMENDATION:
Screening mammogram in one year. (Code:51-O-LD2)

BI-RADS CATEGORY  1: Negative.

## 2024-01-23 ENCOUNTER — Ambulatory Visit

## 2024-01-30 ENCOUNTER — Ambulatory Visit

## 2024-01-30 DIAGNOSIS — J4550 Severe persistent asthma, uncomplicated: Secondary | ICD-10-CM | POA: Diagnosis not present

## 2024-03-26 ENCOUNTER — Ambulatory Visit

## 2024-03-26 DIAGNOSIS — J4550 Severe persistent asthma, uncomplicated: Secondary | ICD-10-CM

## 2024-04-12 DIAGNOSIS — D099 Carcinoma in situ, unspecified: Secondary | ICD-10-CM

## 2024-04-12 HISTORY — DX: Carcinoma in situ, unspecified: D09.9

## 2024-05-21 ENCOUNTER — Ambulatory Visit

## 2024-05-21 DIAGNOSIS — J4550 Severe persistent asthma, uncomplicated: Secondary | ICD-10-CM

## 2024-05-28 ENCOUNTER — Encounter: Admitting: Dermatology

## 2024-05-31 ENCOUNTER — Ambulatory Visit: Admitting: Dermatology

## 2024-05-31 ENCOUNTER — Encounter: Payer: Self-pay | Admitting: Dermatology

## 2024-05-31 VITALS — BP 126/87 | HR 91 | Temp 98.2°F

## 2024-05-31 DIAGNOSIS — L814 Other melanin hyperpigmentation: Secondary | ICD-10-CM

## 2024-05-31 DIAGNOSIS — L578 Other skin changes due to chronic exposure to nonionizing radiation: Secondary | ICD-10-CM

## 2024-05-31 DIAGNOSIS — C44722 Squamous cell carcinoma of skin of right lower limb, including hip: Secondary | ICD-10-CM | POA: Diagnosis not present

## 2024-05-31 DIAGNOSIS — C4492 Squamous cell carcinoma of skin, unspecified: Secondary | ICD-10-CM

## 2024-05-31 MED ORDER — MUPIROCIN 2 % EX OINT: 1.0000 | TOPICAL_OINTMENT | Freq: Two times a day (BID) | CUTANEOUS | 0 refills | Status: AC

## 2024-05-31 NOTE — Patient Instructions (Signed)

## 2024-05-31 NOTE — Progress Notes (Signed)
 Follow-Up Visit   Subjective  Shawna Williams is a 68 y.o. 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  The following portions of the chart were reviewed this encounter and updated as appropriate: medications, allergies, medical history  Review of Systems:  No other skin or systemic complaints except as noted in HPI or Assessment and Plan.  Objective  Well appearing patient in no apparent distress; mood and affect are within normal limits.  A focused examination was performed of the following areas: Right distal pretibial region Relevant physical exam findings are noted in the Assessment and Plan.   right distal pretibial region Pink papule   Assessment & Plan   SQUAMOUS CELL CARCINOMA OF SKIN right distal pretibial region Skin excision  Excision method:  elliptical Lesion length (cm):  0.5 Lesion width (cm):  0.6 Margin per side (cm):  0.3 Total excision diameter (cm):  1.2 Informed consent: discussed and consent obtained   Timeout: patient name, date of birth, surgical site, and procedure verified   Procedure prep:  Patient was prepped and draped in usual sterile fashion Prep type:  Chlorhexidine  Anesthesia: the lesion was anesthetized in a standard fashion   Anesthetic:  1% lidocaine  w/ epinephrine  1-100,000 buffered w/ 8.4% NaHCO3 Instrument used: #15 blade   Hemostasis achieved with: pressure and electrodesiccation   Outcome: patient tolerated procedure well with no complications   Post-procedure details: sterile dressing applied and wound care instructions given   Dressing type: pressure dressing and bandage    Related Medications mupirocin ointment (BACTROBAN) 2 % Apply 1 Application topically 2 (two) times daily.   Return in about 1 month (around 06/30/2024) for wound check.  I, Doyce Pan, CMA, am acting as  scribe for RUFUS CHRISTELLA HOLY, MD.    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 They note that the area has been present for about 4 months increasing in size with time.  There is no history of previous treatment.  Reports no other new or changing lesions and has no other complaints today.  Medications and allergies: see patient chart.  Review of systems: Reviewed 8 systems and notable for the above skin cancer.  All other systems reviewed are unremarkable/negative, unless noted in the HPI. Past medical history, surgical history, family history, social history were also reviewed and are noted in the chart/questionnaire.    PHYSICAL EXAMINATION  General: Well-appearing, in no acute distress, alert and oriented x 4. Vitals reviewed in chart (if available).   Skin: Exam reveals a 0.5 x 0.6 cm erythematous papule and biopsy scar on the right lower leg. There are rhytids, telangiectasias, and lentigines, consistent with photodamage.  Biopsy report(s) reviewed, confirming the diagnosis.   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 Due to location, size, histology, or recurrence and the likelihood of subclinical extension as well as the need to conserve normal surrounding tissue, the patient was deemed acceptable for Excision with Frozen Sections.  The nature and purpose of the procedure, associated benefits and risks including recurrence and scarring, possible complications such as pain, infection, and bleeding, and alternative methods  of treatment if appropriate were discussed with the patient during consent. The lesion location was verified by the patient, by reviewing previous notes, pathology reports, and by photographs as well as angulation measurements if  available.  Informed consent was reviewed and signed by the patient, and timeout was performed at 10:00 AM. See op note below.  2. For the photodamage and solar lentigines, sun protection discussed/information given on OTC sunscreens, and we recommend continued regular follow-up with primary dermatologist every 6 months or sooner for any growing, bleeding, or changing lesions. 3. Prognosis and future surveillance discussed. 4. Letter with treatment outcome sent to referring provider. 5. Pain acetaminophen /ibuprofen  EXCISION WITH FROZEN SECTIONS  Initial size:   0.5 x 0.6 cm Surgical defect/wound size: 1.2 x 1.0 cm Anesthesia:    0.33% lidocaine  with 1:200,000 epinephrine  EBL:    <5 mL Complications:  None Repair type:   Second Intention  Stages: 1  STAGE I: Anesthesia achieved with 0.5% lidocaine  with 1:200,000 epinephrine . ChloraPrep applied. 1 section(s) excised using Frozen Section technique (this includes total peripheral and deep tissue margin excision and evaluation with frozen sections, excised and interpreted by the same physician). The tumor was first debulked and then excised with an approx. 2mm margin.  Hemostasis was achieved with electrocautery as needed.  The specimen was then oriented, subdivided/relaxed, inked, and processed using Frozen Section analysis.  Frozen section analysis revealed a clear deep and peripheral margin.  Reconstruction  Patient was notified of results and repair options were discussed, including second intention healing. After reviewing the advantages and disadvantages of each, we agreed on second intention healing as appropriate.   The surgical site was then lightly scrubbed with sterile, saline-soaked gauze.  The area was bandaged using Vaseline ointment, non-adherent gauze, gauze pads, and tape to provide an adequate pressure dressing.   The patient tolerated the procedure well, was given detailed written and verbal wound care instructions, and was  discharged in good condition.  The patient will follow-up in 4 weeks and as scheduled with primary dermatologist.  Documentation: I have reviewed the above documentation for accuracy and completeness, and I agree with the above.  RUFUS CHRISTELLA HOLY, MD

## 2024-06-28 ENCOUNTER — Ambulatory Visit (INDEPENDENT_AMBULATORY_CARE_PROVIDER_SITE_OTHER): Admitting: Dermatology

## 2024-06-28 ENCOUNTER — Encounter: Payer: Self-pay | Admitting: Dermatology

## 2024-06-28 VITALS — BP 141/86 | HR 95

## 2024-06-28 DIAGNOSIS — Z86007 Personal history of in-situ neoplasm of skin: Secondary | ICD-10-CM | POA: Diagnosis not present

## 2024-06-28 DIAGNOSIS — L905 Scar conditions and fibrosis of skin: Secondary | ICD-10-CM

## 2024-06-28 DIAGNOSIS — C4492 Squamous cell carcinoma of skin, unspecified: Secondary | ICD-10-CM

## 2024-06-28 NOTE — Progress Notes (Signed)
° °  Follow Up Visit   Subjective  Shawna Williams is a 68 y.o. 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 The patient has been bandaging the wound as directed. The endorse the following concerns: none  The following portions of the chart were reviewed this encounter and updated as appropriate: medications, allergies, medical history  Review of Systems:  No other skin or systemic complaints except as noted in HPI or Assessment and Plan.  Objective  Well appearing patient in no apparent distress; mood and affect are within normal limits.  A focal examination was performed including legs All findings within normal limits unless otherwise noted below.  Healing wound with mild erythema  Relevant physical exam findings are noted in the Assessment and Plan.      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 region , treated on 05/31/24, healing by second intention - Reassured that wound is healing well - No evidence of infection - No swelling, induration, purulence, dehiscence, or tenderness out of proportion to the clinical exam, see photo above - Discussed that scars take up to 12 months to mature from the date of surgery - Recommend SPF 30+ to scar daily to prevent purple color from UV exposure during scar maturation process - Discussed that erythema and raised appearance of scar will fade over the next 4-6 months - OK to start scar massage at 4-6 weeks post-op - Can consider silicone based products for scar healing starting at 6 weeks post-op  HISTORY OF SQUAMOUS CELL CARCINOMA IN SITU OF THE SKIN - No evidence of recurrence today - Recommend regular full body skin  exams - Recommend daily broad spectrum sunscreen SPF 30+ to sun-exposed areas, reapply every 2 hours as needed.  - Call if any new or changing lesions are noted between office visits  Return if symptoms worsen or fail to improve.  I, Doyce Pan, CMA, am acting as scribe for RUFUS CHRISTELLA HOLY, MD.   Documentation: I have reviewed the above documentation for accuracy and completeness, and I agree with the above.  RUFUS CHRISTELLA HOLY, MD

## 2024-07-04 ENCOUNTER — Encounter: Payer: Self-pay | Admitting: Dermatology

## 2024-07-16 ENCOUNTER — Ambulatory Visit

## 2024-07-16 DIAGNOSIS — J4550 Severe persistent asthma, uncomplicated: Secondary | ICD-10-CM | POA: Diagnosis not present

## 2024-08-15 ENCOUNTER — Other Ambulatory Visit: Payer: Self-pay | Admitting: Nurse Practitioner

## 2024-09-10 ENCOUNTER — Ambulatory Visit

## 2024-11-28 ENCOUNTER — Ambulatory Visit: Admitting: Allergy & Immunology
